# Patient Record
Sex: Female | Born: 1980 | Race: Black or African American | Hispanic: No | Smoking: Current every day smoker
Health system: Southern US, Community
[De-identification: ages and names within clinical notes are randomized; demographics above are authoritative.]

## PROBLEM LIST (undated history)

## (undated) DIAGNOSIS — I10 Essential (primary) hypertension: Secondary | ICD-10-CM

## (undated) DIAGNOSIS — L732 Hidradenitis suppurativa: Secondary | ICD-10-CM

## (undated) HISTORY — DX: Hidradenitis suppurativa: L73.2

## (undated) HISTORY — PX: OTHER SURGICAL HISTORY: SHX169

---

## 2016-06-29 ENCOUNTER — Emergency Department: Payer: Medicaid Other

## 2016-06-29 ENCOUNTER — Emergency Department
Admission: EM | Admit: 2016-06-29 | Discharge: 2016-06-29 | Disposition: A | Discharge status: Home / Self Care | Payer: Medicaid Other | Attending: Emergency Medicine | Admitting: Emergency Medicine

## 2016-06-29 ENCOUNTER — Encounter: Payer: Self-pay | Admitting: Medical Oncology

## 2016-06-29 DIAGNOSIS — J40 Bronchitis, not specified as acute or chronic: Secondary | ICD-10-CM | POA: Insufficient documentation

## 2016-06-29 DIAGNOSIS — I10 Essential (primary) hypertension: Secondary | ICD-10-CM | POA: Insufficient documentation

## 2016-06-29 HISTORY — DX: Essential (primary) hypertension: I10

## 2016-06-29 MED ORDER — PREDNISONE 20 MG PO TABS: 40.0000 mg | ORAL_TABLET | Freq: Every day | ORAL | 0 refills | Status: DC

## 2016-06-29 MED ORDER — ACETAMINOPHEN 325 MG PO TABS
650.0000 mg | ORAL_TABLET | Freq: Once | ORAL | Status: AC
  Administered 2016-06-29: 650 mg via ORAL

## 2016-06-29 MED ORDER — BENZONATATE 100 MG PO CAPS: 100.0000 mg | ORAL_CAPSULE | Freq: Three times a day (TID) | ORAL | 0 refills | Status: AC | PRN

## 2016-06-29 MED ORDER — ACETAMINOPHEN 325 MG PO TABS
ORAL_TABLET | ORAL | Status: AC
  Filled 2016-06-29: qty 2

## 2016-06-29 MED ORDER — ALBUTEROL SULFATE HFA 108 (90 BASE) MCG/ACT IN AERS: 2.0000 | INHALATION_SPRAY | Freq: Four times a day (QID) | RESPIRATORY_TRACT | 2 refills | Status: AC | PRN

## 2016-06-29 NOTE — ED Provider Notes (Signed)
[facility] Provider Note   CSN: [id number] Arrival date & time: 06/29/16  1658     History   Chief Complaint Chief Complaint  Patient presents with  . Cough  . Nasal Congestion    HPI Mary Lawson is a 36 y.o. female presents to the emergency department for evaluation of cough times one month. Patient has had cough and cold symptoms 1 month. Over the last month her cough has become more severe. Cough is slightly productive. No blood. She wakes up in the morning sometimes with chest tightness and wheezing that improves as the day goes on. She denies any fevers. She has a sharp pain along her left thoracic spine that is increased with coughing and taking a deep breath. She denies any chest pain or shortness of breath. No sore throat. Has had some mild nasal congestion. She is not taking any medications for pain. She does not have a PCP. Denies any headache, vision changes chest pain nausea vomiting or weakness. Patient denies any chest pain with exertion.   HPI  Past Medical History:  Diagnosis Date  . Hypertension     There are no active problems to display for this patient.   No past surgical history on file.  OB History    No data available       Home Medications    Prior to Admission medications   Medication Sig Start Date End Date Taking? Authorizing Provider  albuterol (PROVENTIL HFA;VENTOLIN HFA) 108 (90 Base) MCG/ACT inhaler Inhale 2 puffs into the lungs every 6 (six) hours as needed for wheezing or shortness of breath. 06/29/16   Duanne Guess, PA-C  benzonatate (TESSALON PERLES) 100 MG capsule Take 1 capsule (100 mg total) by mouth 3 (three) times daily as needed for cough. 06/29/16 06/29/17  Duanne Guess, PA-C  predniSONE (DELTASONE) 20 MG tablet Take 2 tablets (40 mg total) by mouth daily. 06/29/16   Duanne Guess, PA-C    Family History No family history on file.  Social History Social History  Substance Use Topics  . Smoking status:  Not on file  . Smokeless tobacco: Not on file  . Alcohol use Not on file     Allergies   Codeine; Darvon [propoxyphene]; and Tramadol   Review of Systems Review of Systems  Constitutional: Negative for activity change, chills, fatigue and fever.  HENT: Positive for congestion. Negative for sinus pressure, sore throat and trouble swallowing.   Eyes: Negative for visual disturbance.  Respiratory: Positive for cough, chest tightness and wheezing (in the morning mild). Negative for shortness of breath.   Cardiovascular: Negative for chest pain and leg swelling.  Gastrointestinal: Negative for abdominal pain, diarrhea, nausea and vomiting.  Genitourinary: Negative for dysuria.  Musculoskeletal: Negative for arthralgias and gait problem.  Skin: Negative for rash.  Neurological: Negative for weakness, numbness and headaches.  Hematological: Negative for adenopathy.  Psychiatric/Behavioral: Negative for agitation, behavioral problems and confusion.     Physical Exam Updated Vital Signs BP (!) 178/91 (BP Location: Left Arm)   Pulse 62   Temp 98 F (36.7 C) (Oral)   Resp 18   Ht 5\' 2"  (1.575 m)   Wt 90.7 kg   LMP 06/27/2016   SpO2 100%   BMI 36.58 kg/m   Physical Exam  Constitutional: She is oriented to person, place, and time. She appears well-developed and well-nourished. No distress.  HENT:  Head: Normocephalic and atraumatic.  Right Ear: External ear normal.  Left Ear: External  ear normal.  Mouth/Throat: Oropharynx is clear and moist. No oropharyngeal exudate.  Eyes: EOM are normal. Pupils are equal, round, and reactive to light. Right eye exhibits no discharge. Left eye exhibits no discharge.  Neck: Normal range of motion. Neck supple.  Cardiovascular: Normal rate, regular rhythm and intact distal pulses.   Pulmonary/Chest: Effort normal and breath sounds normal. No respiratory distress. She has no wheezes. She has no rales. She exhibits no tenderness.  Abdominal: Soft.  She exhibits no distension. There is no tenderness. There is no rebound and no guarding. No hernia.  Musculoskeletal: Normal range of motion. She exhibits no edema.  Mild tenderness along the left thoracic paravertebral muscles of the ribs with no spinous process tenderness. Slight increase in pain with taking a deep breath along the thoracic muscles of the left side. No point tenderness along the ribs.  Lymphadenopathy:    She has no cervical adenopathy.  Neurological: She is alert and oriented to person, place, and time. She has normal reflexes.  Skin: Skin is warm and dry.  Psychiatric: She has a normal mood and affect. Her behavior is normal. Thought content normal.     ED Treatments / Results  Labs (all labs ordered are listed, but only abnormal results are displayed) Labs Reviewed - No data to display  EKG  EKG Interpretation None       Radiology Dg Chest 2 View  Result Date: 06/29/2016 CLINICAL DATA:  Cough and shortness of breath for 1 month EXAM: CHEST  2 VIEW COMPARISON:  None. FINDINGS: Normal heart size and mediastinal contours. No acute infiltrate or edema. No effusion or pneumothorax. No osseous findings. IMPRESSION: Negative chest. Electronically Signed   By: Monte Fantasia M.D.   On: 06/29/2016 18:03    Procedures Procedures (including critical care time)  Medications Ordered in ED Medications - No data to display   Initial Impression / Assessment and Plan / ED Course  I have reviewed the triage vital signs and the nursing notes.  Pertinent labs & imaging results that were available during my care of the patient were reviewed by me and considered in my medical decision making (see chart for details).     36 year old female with bronchitis. She is given a prescription for cough medication, prednisone, albuterol inhaler. She is educated on signs and symptoms return to the clinic for. She will establish care with a new PCP.  Final Clinical Impressions(s) / ED  Diagnoses   Final diagnoses:  Bronchitis    New Prescriptions New Prescriptions   ALBUTEROL (PROVENTIL HFA;VENTOLIN HFA) 108 (90 BASE) MCG/ACT INHALER    Inhale 2 puffs into the lungs every 6 (six) hours as needed for wheezing or shortness of breath.   BENZONATATE (TESSALON PERLES) 100 MG CAPSULE    Take 1 capsule (100 mg total) by mouth 3 (three) times daily as needed for cough.   PREDNISONE (DELTASONE) 20 MG TABLET    Take 2 tablets (40 mg total) by mouth daily.     Duanne Guess, PA-C 06/29/16 1824    Nance Pear, MD 06/29/16 2104

## 2016-06-29 NOTE — Discharge Instructions (Signed)
Please take medications as prescribed. Return to the ER for any shortness of breath, difficulty breathing, worsening symptoms such as chest pain shortness of breath or any urgent changes in your health. Please establish care with a PCP tomorrow. You may call [facility] to establish care with a PCP.

## 2016-06-29 NOTE — ED Triage Notes (Signed)
Pt reports she had a cold about a month ago and since then she has had a lingering productive cough with congestion. Pt denies fever, in NAD.

## 2016-06-29 NOTE — ED Notes (Signed)
Pt discharged home after verbalizing understanding of discharge instructions; nad noted. 

## 2016-06-29 NOTE — ED Notes (Signed)
Pt presents with cough x 1 month; becoming productive; denies fevers. States she has some sob sometimes and tightness in her chest. Pt alert & oriented with NAD noted.

## 2017-09-12 ENCOUNTER — Encounter: Payer: Self-pay | Admitting: Emergency Medicine

## 2017-09-12 ENCOUNTER — Emergency Department
Admission: EM | Admit: 2017-09-12 | Discharge: 2017-09-12 | Disposition: A | Discharge status: Home / Self Care | Payer: Self-pay | Attending: Emergency Medicine | Admitting: Emergency Medicine

## 2017-09-12 DIAGNOSIS — Y9389 Activity, other specified: Secondary | ICD-10-CM | POA: Insufficient documentation

## 2017-09-12 DIAGNOSIS — S39011A Strain of muscle, fascia and tendon of abdomen, initial encounter: Secondary | ICD-10-CM

## 2017-09-12 DIAGNOSIS — Y998 Other external cause status: Secondary | ICD-10-CM | POA: Insufficient documentation

## 2017-09-12 DIAGNOSIS — Z79899 Other long term (current) drug therapy: Secondary | ICD-10-CM | POA: Insufficient documentation

## 2017-09-12 DIAGNOSIS — X58XXXA Exposure to other specified factors, initial encounter: Secondary | ICD-10-CM | POA: Insufficient documentation

## 2017-09-12 DIAGNOSIS — Y929 Unspecified place or not applicable: Secondary | ICD-10-CM | POA: Insufficient documentation

## 2017-09-12 DIAGNOSIS — I10 Essential (primary) hypertension: Secondary | ICD-10-CM | POA: Insufficient documentation

## 2017-09-12 LAB — URINALYSIS, COMPLETE (UACMP) WITH MICROSCOPIC
Bilirubin Urine: NEGATIVE
Glucose, UA: NEGATIVE mg/dL
Hgb urine dipstick: NEGATIVE
Ketones, ur: NEGATIVE mg/dL
Nitrite: NEGATIVE
PROTEIN: NEGATIVE mg/dL
Specific Gravity, Urine: 1.021 (ref 1.005–1.030)
pH: 6 (ref 5.0–8.0)

## 2017-09-12 MED ORDER — METHOCARBAMOL 500 MG PO TABS: 500.0000 mg | ORAL_TABLET | Freq: Three times a day (TID) | ORAL | 0 refills | Status: AC | PRN

## 2017-09-12 MED ORDER — MELOXICAM 15 MG PO TABS: 15.0000 mg | ORAL_TABLET | Freq: Every day | ORAL | 1 refills | Status: AC

## 2017-09-12 NOTE — ED Notes (Signed)
See triage note  Presents with left flank pain which started about 3 days ago  Denies any injury ,n/v  Or urinary sxs'  Ambulates well to treatment room

## 2017-09-12 NOTE — ED Provider Notes (Signed)
[facility] Emergency Department Provider Note  ____________________________________________  Time seen: Approximately 4:02 PM  I have reviewed the triage vital signs and the nursing notes.   HISTORY  Chief Complaint Flank Pain    HPI Mary Lawson is a 37 y.o. female presents to the emergency department with left side pain for the past 2 days.  Patient reports that her physical demands have increased at work and she is now been working 12-hour days.  Patient denies falls or mechanisms of trauma.  She denies dysuria, hematuria or low back pain.  Patient reports that she has had a urinary tract infection in the past and her symptoms feel completely different.  She denies possibility of pregnancy as she is not sexually active.  Patient reports that she had some mild nausea today but typically gets some nausea before she has a meal.  She has not experienced vomiting or emesis.  She denies abdominal pain, pelvic pain and vaginal bleeding.  Patient reports that muscles around her abdomen feel tighter and pain is reproducible with palpation. No fever or chills. Patient has taken one Ibuprofen this morning    Past Medical History:  Diagnosis Date  . Hypertension     There are no active problems to display for this patient.   Prior to Admission medications   Medication Sig Start Date End Date Taking? Authorizing Provider  albuterol (PROVENTIL HFA;VENTOLIN HFA) 108 (90 Base) MCG/ACT inhaler Inhale 2 puffs into the lungs every 6 (six) hours as needed for wheezing or shortness of breath. 06/29/16   Duanne Guess, PA-C  meloxicam (MOBIC) 15 MG tablet Take 1 tablet (15 mg total) by mouth daily for 7 days. 09/12/17 09/19/17  Lannie Fields, PA-C  methocarbamol (ROBAXIN) 500 MG tablet Take 1 tablet (500 mg total) by mouth 3 (three) times daily as needed for up to 5 days. 09/12/17 09/17/17  Lannie Fields, PA-C  predniSONE (DELTASONE) 20 MG tablet Take 2 tablets (40 mg total)  by mouth daily. 06/29/16   Duanne Guess, PA-C    Allergies Codeine; Darvon [propoxyphene]; and Tramadol  No family history on file.  Social History Social History   Tobacco Use  . Smoking status: Not on file  Substance Use Topics  . Alcohol use: Not on file  . Drug use: Not on file     Review of Systems  Constitutional: No fever/chills Eyes: No visual changes. No discharge ENT: No upper respiratory complaints. Cardiovascular: no chest pain. Respiratory: no cough. No SOB. Gastrointestinal: No abdominal pain.  No nausea, no vomiting.  No diarrhea.  No constipation. Genitourinary: Negative for dysuria. No hematuria Musculoskeletal: Patient has left side pain. Skin: Negative for rash, abrasions, lacerations, ecchymosis. Neurological: Negative for headaches, focal weakness or numbness.   ____________________________________________   PHYSICAL EXAM:  VITAL SIGNS: ED Triage Vitals  Enc Vitals Group     BP --      Pulse Rate 09/12/17 1415 71     Resp 09/12/17 1415 15     Temp 09/12/17 1415 98.6 F (37 C)     Temp Source 09/12/17 1415 Oral     SpO2 09/12/17 1415 100 %     Weight 09/12/17 1416 215 lb (97.5 kg)     Height 09/12/17 1416 5\' 2"  (1.575 m)     Head Circumference --      Peak Flow --      Pain Score 09/12/17 1416 8     Pain Loc --  Pain Edu? --      Excl. in [place]? --      Constitutional: Alert and oriented. Well appearing and in no acute distress. Eyes: Conjunctivae are normal. PERRL. EOMI. Head: Atraumatic. Cardiovascular: Normal rate, regular rhythm. Normal S1 and S2.  Good peripheral circulation. Respiratory: Normal respiratory effort without tachypnea or retractions. Lungs CTAB. Good air entry to the bases with no decreased or absent breath sounds. Gastrointestinal: Bowel sounds 4 quadrants. Soft and nontender to palpation. No guarding or rigidity. No palpable masses. No distention. No CVA tenderness. Musculoskeletal: Full range of motion to  all extremities. No gross deformities appreciated.  Patient has reproducible tenderness to palpation along the left external oblique muscle. Neurologic:  Normal speech and language. No gross focal neurologic deficits are appreciated.  Skin:  Skin is warm, dry and intact. No rash noted. Psychiatric: Mood and affect are normal. Speech and behavior are normal. Patient exhibits appropriate insight and judgement.   ____________________________________________   LABS (all labs ordered are listed, but only abnormal results are displayed)  Labs Reviewed  URINALYSIS, COMPLETE (UACMP) WITH MICROSCOPIC - Abnormal; Notable for the following components:      Result Value   Color, Urine YELLOW (*)    APPearance CLOUDY (*)    Leukocytes, UA SMALL (*)    Bacteria, UA FEW (*)    Squamous Epithelial / LPF >50 (*)    All other components within normal limits  PREGNANCY, URINE   ____________________________________________  EKG   ____________________________________________  RADIOLOGY   No results found.  ____________________________________________    PROCEDURES  Procedure(s) performed:    Procedures    Medications - No data to display   ____________________________________________   INITIAL IMPRESSION / ASSESSMENT AND PLAN / ED COURSE  Pertinent labs & imaging results that were available during my care of the patient were reviewed by me and considered in my medical decision making (see chart for details).  Review of the [place] CSRS was performed in accordance of the [place] prior to dispensing any controlled drugs.      Assessment and plan Abdominal wall strain Patient presents to the emergency department with left side pain after increased physical activity at work.  Patient reports that she presents to the emergency department for a work note as she had to leave early due to pain.  Original differential diagnosis included cystitis, pyelonephritis and abdominal wall strain.   Urinalysis is reassuring in the emergency department and patient is asymptomatic for cystitis or pyelonephritis.  On physical exam, patient's symptoms were reproduced with palpation and overall abdominal exam was reassuring.  Patient was discharged with meloxicam and Robaxin and desired work note was provided.  All patient questions were answered.    ____________________________________________  FINAL CLINICAL IMPRESSION(S) / ED DIAGNOSES  Final diagnoses:  Strain of abdominal wall, initial encounter      NEW MEDICATIONS STARTED DURING THIS VISIT:  ED Discharge Orders        Ordered    meloxicam (MOBIC) 15 MG tablet  Daily     09/12/17 1600    methocarbamol (ROBAXIN) 500 MG tablet  3 times daily PRN     09/12/17 1600          This chart was dictated using voice recognition software/Dragon. Despite best efforts to proofread, errors can occur which can change the meaning. Any change was purely unintentional.    Lannie Fields, PA-C 09/12/17 1610    Nance Pear, MD 09/12/17 [phone]

## 2017-09-12 NOTE — ED Triage Notes (Signed)
Pt reports left flank pain x2 days, reports pain has been getting worse. Denies V/D, reports nausea. Denies any urinary symptoms.

## 2018-09-20 ENCOUNTER — Other Ambulatory Visit: Payer: Self-pay

## 2018-09-20 ENCOUNTER — Emergency Department: Payer: Self-pay

## 2018-09-20 ENCOUNTER — Emergency Department
Admission: EM | Admit: 2018-09-20 | Discharge: 2018-09-20 | Disposition: A | Discharge status: Home / Self Care | Payer: Self-pay | Attending: Emergency Medicine | Admitting: Emergency Medicine

## 2018-09-20 ENCOUNTER — Encounter: Payer: Self-pay | Admitting: Emergency Medicine

## 2018-09-20 DIAGNOSIS — I10 Essential (primary) hypertension: Secondary | ICD-10-CM | POA: Insufficient documentation

## 2018-09-20 DIAGNOSIS — N83202 Unspecified ovarian cyst, left side: Secondary | ICD-10-CM | POA: Insufficient documentation

## 2018-09-20 DIAGNOSIS — R102 Pelvic and perineal pain: Secondary | ICD-10-CM | POA: Insufficient documentation

## 2018-09-20 DIAGNOSIS — F17200 Nicotine dependence, unspecified, uncomplicated: Secondary | ICD-10-CM | POA: Insufficient documentation

## 2018-09-20 LAB — URINALYSIS, COMPLETE (UACMP) WITH MICROSCOPIC
Bacteria, UA: NONE SEEN
Bilirubin Urine: NEGATIVE
Glucose, UA: NEGATIVE mg/dL
Hgb urine dipstick: NEGATIVE
Ketones, ur: NEGATIVE mg/dL
Nitrite: NEGATIVE
Protein, ur: NEGATIVE mg/dL
Specific Gravity, Urine: 1.016 (ref 1.005–1.030)
pH: 7 (ref 5.0–8.0)

## 2018-09-20 LAB — COMPREHENSIVE METABOLIC PANEL
ALT: 20 U/L (ref 0–44)
AST: 24 U/L (ref 15–41)
Albumin: 4.4 g/dL (ref 3.5–5.0)
Alkaline Phosphatase: 45 U/L (ref 38–126)
Anion gap: 12 (ref 5–15)
BUN: 11 mg/dL (ref 6–20)
CO2: 21 mmol/L — ABNORMAL LOW (ref 22–32)
Calcium: 9.6 mg/dL (ref 8.9–10.3)
Chloride: 103 mmol/L (ref 98–111)
Creatinine, Ser: 0.8 mg/dL (ref 0.44–1.00)
GFR calc Af Amer: >60 mL/min (ref >60)
GFR calc non Af Amer: >60 mL/min (ref >60)
Glucose, Bld: 119 mg/dL — ABNORMAL HIGH (ref 70–99)
Potassium: 4 mmol/L (ref 3.5–5.1)
Sodium: 136 mmol/L (ref 135–145)
Total Bilirubin: 0.8 mg/dL (ref 0.3–1.2)
Total Protein: 8.5 g/dL — ABNORMAL HIGH (ref 6.5–8.1)

## 2018-09-20 LAB — CBC
HCT: 44.4 % (ref 36.0–46.0)
Hemoglobin: 14.4 g/dL (ref 12.0–15.0)
MCH: 28.3 pg (ref 26.0–34.0)
MCHC: 32.4 g/dL (ref 30.0–36.0)
MCV: 87.2 fL (ref 80.0–100.0)
Platelets: 369 10*3/uL (ref 150–400)
RBC: 5.09 MIL/uL (ref 3.87–5.11)
RDW: 15 % (ref 11.5–15.5)
WBC: 14 10*3/uL — ABNORMAL HIGH (ref 4.0–10.5)
nRBC: 0 % (ref 0.0–0.2)

## 2018-09-20 LAB — POCT PREGNANCY, URINE: Preg Test, Ur: NEGATIVE

## 2018-09-20 LAB — LIPASE, BLOOD: Lipase: 26 U/L (ref 11–51)

## 2018-09-20 MED ORDER — FENTANYL CITRATE (PF) 100 MCG/2ML IJ SOLN
50.0000 ug | Freq: Once | INTRAMUSCULAR | Status: AC
Start: 2018-09-20 — End: 2018-09-20
  Administered 2018-09-20: 20:00:00 50 ug via INTRAVENOUS
  Filled 2018-09-20: qty 2

## 2018-09-20 MED ORDER — KETOROLAC TROMETHAMINE 10 MG PO TABS: 10.0000 mg | ORAL_TABLET | Freq: Three times a day (TID) | ORAL | 0 refills | Status: AC | PRN

## 2018-09-20 MED ORDER — KETOROLAC TROMETHAMINE 30 MG/ML IJ SOLN
30.0000 mg | Freq: Once | INTRAMUSCULAR | Status: AC
  Administered 2018-09-20: 22:00:00 30 mg via INTRAVENOUS
  Filled 2018-09-20: qty 1

## 2018-09-20 NOTE — Discharge Instructions (Addendum)
Please seek medical attention for any high fevers, chest pain, shortness of breath, change in behavior, persistent vomiting, bloody stool or any other new or concerning symptoms.  

## 2018-09-20 NOTE — ED Triage Notes (Signed)
Pt here for LLQ pain/left pelvic pain.  Started yesterday.  No vaginal bleeding. No fevers or NVD.  Pt has hx of ectopic pregnancy and kidney stones.  UPT in triage negative.  No hematuria per pt. VSS.  LMP earlier this month.

## 2018-09-20 NOTE — ED Provider Notes (Signed)
[facility] Emergency Department Provider Note   ____________________________________________   I have reviewed the triage vital signs and the nursing notes.   HISTORY  Chief Complaint Abdominal Pain   History limited by: Not Limited   HPI Mary Lawson is a 38 y.o. female who presents to the emergency department today because of concerns for left lower abdominal pain.  Patient states she started having some cramping at last night.  Today however the pain became more severe.  She denies any radiation to the pain.  She states it does remind her of when she has had ectopic pregnancies in the past.  She also has history of kidney stones but denies any change in her urine.  She denies any change in defecation.  No abnormal vaginal bleeding or discharge.  Patient denies any nausea vomiting or fevers.  She denies any trauma to her abdomen.   Records reviewed. Per medical record review patient has a history of HTN  Past Medical History:  Diagnosis Date  . Hypertension     There are no active problems to display for this patient.   History reviewed. No pertinent surgical history.  Prior to Admission medications   Medication Sig Start Date End Date Taking? Authorizing Provider  albuterol (PROVENTIL HFA;VENTOLIN HFA) 108 (90 Base) MCG/ACT inhaler Inhale 2 puffs into the lungs every 6 (six) hours as needed for wheezing or shortness of breath. 06/29/16   Duanne Guess, PA-C  predniSONE (DELTASONE) 20 MG tablet Take 2 tablets (40 mg total) by mouth daily. 06/29/16   Duanne Guess, PA-C    Allergies Codeine, Darvon [propoxyphene], and Tramadol  History reviewed. No pertinent family history.  Social History Social History   Tobacco Use  . Smoking status: Current Every Day Smoker  . Smokeless tobacco: Never Used  Substance Use Topics  . Alcohol use: Never    Frequency: Never  . Drug use: Never    Review of Systems Constitutional: No  fever/chills Eyes: No visual changes. ENT: No sore throat. Cardiovascular: Denies chest pain. Respiratory: Denies shortness of breath. Gastrointestinal: Positive for left lower quadrant pain.    Genitourinary: Negative for dysuria. Musculoskeletal: Negative for back pain. Skin: Negative for rash. Neurological: Negative for headaches, focal weakness or numbness.  ____________________________________________   PHYSICAL EXAM:  VITAL SIGNS: ED Triage Vitals  Enc Vitals Group     BP 09/20/18 1535 (!) 165/90     Pulse Rate 09/20/18 1535 84     Resp 09/20/18 1535 18     Temp 09/20/18 1535 98.6 F (37 C)     Temp Source 09/20/18 1535 Oral     SpO2 09/20/18 1535 100 %     Weight 09/20/18 1532 205 lb (93 kg)     Height 09/20/18 1532 5\' 2"  (1.575 m)     Head Circumference --      Peak Flow --      Pain Score 09/20/18 1531 10   Constitutional: Alert and oriented.  Eyes: Conjunctivae are normal.  ENT      Head: Normocephalic and atraumatic.      Nose: No congestion/rhinnorhea.      Mouth/Throat: Mucous membranes are moist.      Neck: No stridor. Hematological/Lymphatic/Immunilogical: No cervical lymphadenopathy. Cardiovascular: Normal rate, regular rhythm.  No murmurs, rubs, or gallops.  Respiratory: Normal respiratory effort without tachypnea nor retractions. Breath sounds are clear and equal bilaterally. No wheezes/rales/rhonchi. Gastrointestinal: Soft tender to palpation over the left abdomen, worse in the left  lower quadrant.  Genitourinary: Deferred Musculoskeletal: Normal range of motion in all extremities. No lower extremity edema. Neurologic:  Normal speech and language. No gross focal neurologic deficits are appreciated.  Skin:  Skin is warm, dry and intact. No rash noted. Psychiatric: Mood and affect are normal. Speech and behavior are normal. Patient exhibits appropriate insight and judgment.  ____________________________________________    LABS (pertinent  positives/negatives)  Lipase 26 CMP wnl except co2 21, glu 119, t pro 8.5 CBC wbc 14.0, hgb 14.4, plt 369 Upreg neg UA hazy, wbc 6-10 squamous 6-10 ____________________________________________   EKG  None  ____________________________________________    RADIOLOGY  US pelvis Left hemorrhagic cyst  ____________________________________________   PROCEDURES  Procedures  ____________________________________________   INITIAL IMPRESSION / ASSESSMENT AND PLAN / ED COURSE  Pertinent labs & imaging results that were available during my care of the patient were reviewed by me and considered in my medical decision making (see chart for details).   Patient presented to the emergency department today with concerns for left lower quadrant abdominal pain.  Differential would be broad player work-up did show a slight leukocytosis in the blood.  Ultrasound was performed which showed hemorrhagic cyst.  At this point I do think the hemorrhagic cyst is likely the cause of the patient's pain. She did feel improvement after medication here in the emergency department.  Patient will be given OB/GYN follow-up information.  Discussed return precautions.  ____________________________________________   FINAL CLINICAL IMPRESSION(S) / ED DIAGNOSES  Final diagnoses:  Left adnexal tenderness  Cyst of left ovary     Note: This dictation was prepared with Dragon dictation. Any transcriptional errors that result from this process are unintentional     Nance Pear, MD 09/20/18 2246

## 2018-09-22 ENCOUNTER — Encounter: Payer: Self-pay | Admitting: Obstetrics & Gynecology

## 2018-09-22 ENCOUNTER — Other Ambulatory Visit: Payer: Self-pay

## 2018-09-22 ENCOUNTER — Ambulatory Visit (INDEPENDENT_AMBULATORY_CARE_PROVIDER_SITE_OTHER): Payer: Self-pay | Admitting: Obstetrics & Gynecology

## 2018-09-22 VITALS — BP 130/80 | Ht 62.0 in | Wt 216.0 lb

## 2018-09-22 DIAGNOSIS — N83202 Unspecified ovarian cyst, left side: Secondary | ICD-10-CM

## 2018-09-22 DIAGNOSIS — Z3042 Encounter for surveillance of injectable contraceptive: Secondary | ICD-10-CM | POA: Insufficient documentation

## 2018-09-22 MED ORDER — OXYCODONE-ACETAMINOPHEN 5-325 MG PO TABS: 1.0000 | ORAL_TABLET | ORAL | 0 refills | Status: DC | PRN

## 2018-09-22 MED ORDER — MEDROXYPROGESTERONE ACETATE 150 MG/ML IM SUSP: 150.0000 mg | INTRAMUSCULAR | 0 refills | Status: DC

## 2018-09-22 NOTE — Progress Notes (Signed)
  HPI: Patient is a 38 y.o. [id number] who LMP was Patient's last menstrual period was 08/29/2018., presents today for a problem visit.  She complains of recent findings of Left ovarion cyst by Ultrasound - Pelvic Vaginal.  Pt has had symptoms of pain, this began this week and was seen in ER 2 days ago.  Pain is LLQ, rad to back, assoc w mild nausea, severe at times, Toradol and Meloxicam not helping, nothing makes it worse, no other context and has not had h/o ovarian cysts.  Was on Depo in past for period control, on no contraception or blood thinners now.  Desires Depo again.  Periods are heavy..  Previous evaluation: emergency room visit on 7/22. Prior Diagnosis: prior ovarian cyst rupture. Previous Treatment: NSAID Mobic, Toradol.  PMHx: She  has a past medical history of Hypertension. Also,  has no past surgical history on file., family history includes Brain cancer in her maternal grandmother.,  reports that she has been smoking. She has never used smokeless tobacco. She reports that she does not drink alcohol or use drugs.  She has a current medication list which includes the following prescription(s): ketorolac, albuterol, medroxyprogesterone, oxycodone-acetaminophen, and prednisone. Also, is allergic to codeine; darvon [propoxyphene]; and tramadol.  Review of Systems  Constitutional: Negative for chills, fever and malaise/fatigue.  HENT: Negative for congestion, sinus pain and sore throat.   Eyes: Negative for blurred vision and pain.  Respiratory: Negative for cough and wheezing.   Cardiovascular: Negative for chest pain and leg swelling.  Gastrointestinal: Negative for abdominal pain, constipation, diarrhea, heartburn, nausea and vomiting.  Genitourinary: Negative for dysuria, frequency, hematuria and urgency.  Musculoskeletal: Negative for back pain, joint pain, myalgias and neck pain.  Skin: Negative for itching and rash.  Neurological: Negative for dizziness, tremors and weakness.   Endo/Heme/Allergies: Does not bruise/bleed easily.  Psychiatric/Behavioral: Negative for depression. The patient is not nervous/anxious and does not have insomnia.   All other systems reviewed and are negative.   Objective: BP 130/80   Ht 5\' 2"  (1.575 m)   Wt 216 lb (98 kg)   LMP 08/29/2018   BMI 39.51 kg/m  Physical Exam Constitutional:      General: She is not in acute distress.    Appearance: She is well-developed.  Musculoskeletal: Normal range of motion.  Neurological:     Mental Status: She is alert and oriented to person, place, and time.  Skin:    General: Skin is warm and dry.  Vitals signs reviewed.   Pelvic deferred  ASSESSMENT/PLAN:  New onset Ovarian Cyst w Pain  Problem List Items Addressed This Visit      Endocrine   Left ovarian cyst - Primary   Relevant Medications   oxyCODONE-acetaminophen (PERCOCET) 5-325 MG tablet, #20, no RF   Other Relevant Orders   US PELVIC COMPLETE WITH TRANSVAGINAL for follow up in 6 weeks Info provided on cyst care and expectations     Other   Encounter for surveillance of injectable contraceptive   Relevant Medications   medroxyPROGESTERone (DEPO-PROVERA) 150 MG/ML injection    A total of 45 minutes were spent face-to-face with the patient during this encounter and over half of that time dealt with counseling and coordination of care.   Barnett Applebaum, MD, [facility], [facility] 09/22/2018  3:41 PM

## 2018-09-22 NOTE — Patient Instructions (Signed)
Ovarian Cyst     An ovarian cyst is a fluid-filled sac that forms on an ovary. The ovaries are small organs that produce eggs in women. Various types of cysts can form on the ovaries. Some may cause symptoms and require treatment. Most ovarian cysts go away on their own, are not cancerous (are benign), and do not cause problems. Common types of ovarian cysts include:  Functional (follicle) cysts. ? Occur during the menstrual cycle, and usually go away with the next menstrual cycle if you do not get pregnant. ? Usually cause no symptoms.  Endometriomas. ? Are cysts that form from the tissue that lines the uterus (endometrium). ? Are sometimes called "chocolate cysts" because they become filled with blood that turns brown. ? Can cause pain in the lower abdomen during intercourse and during your period.  Cystadenoma cysts. ? Develop from cells on the outside surface of the ovary. ? Can get very large and cause lower abdomen pain and pain with intercourse. ? Can cause severe pain if they twist or break open (rupture).  Dermoid cysts. ? Are sometimes found in both ovaries. ? May contain different kinds of body tissue, such as skin, teeth, hair, or cartilage. ? Usually do not cause symptoms unless they get very big.  Theca lutein cysts. ? Occur when too much of a certain hormone (human chorionic gonadotropin) is produced and overstimulates the ovaries to produce an egg. ? Are most common after having procedures used to assist with the conception of a baby (in vitro fertilization). What are the causes? Ovarian cysts may be caused by:  Ovarian hyperstimulation syndrome. This is a condition that can develop from taking fertility medicines. It causes multiple large ovarian cysts to form.  Polycystic ovarian syndrome (PCOS). This is a common hormonal disorder that can cause ovarian cysts, as well as problems with your period or fertility. What increases the risk? The following factors may  make you more likely to develop ovarian cysts:  Being overweight or obese.  Taking fertility medicines.  Taking certain forms of hormonal birth control.  Smoking. What are the signs or symptoms? Many ovarian cysts do not cause symptoms. If symptoms are present, they may include:  Pelvic pain or pressure.  Pain in the lower abdomen.  Pain during sex.  Abdominal swelling.  Abnormal menstrual periods.  Increasing pain with menstrual periods. How is this diagnosed? These cysts are commonly found during a routine pelvic exam. You may have tests to find out more about the cyst, such as:  Ultrasound.  X-ray of the pelvis.  CT scan.  MRI.  Blood tests. How is this treated? Many ovarian cysts go away on their own without treatment. Your health care provider may want to check your cyst regularly for 2-3 months to see if it changes. If you are in menopause, it is especially important to have your cyst monitored closely because menopausal women have a higher rate of ovarian cancer. When treatment is needed, it may include:  Medicines to help relieve pain.  A procedure to drain the cyst (aspiration).  Surgery to remove the whole cyst.  Hormone treatment or birth control pills. These methods are sometimes used to help dissolve a cyst. Follow these instructions at home:  Take over-the-counter and prescription medicines only as told by your health care provider.  Do not drive or use heavy machinery while taking prescription pain medicine.  Get regular pelvic exams and Pap tests as often as told by your health care provider.    Return to your normal activities as told by your health care provider. Ask your health care provider what activities are safe for you.  Do not use any products that contain nicotine or tobacco, such as cigarettes and e-cigarettes. If you need help quitting, ask your health care provider.  Keep all follow-up visits as told by your health care provider.  This is important. Contact a health care provider if:  Your periods are late, irregular, or painful, or they stop.  You have pelvic pain that does not go away.  You have pressure on your bladder or trouble emptying your bladder completely.  You have pain during sex.  You have any of the following in your abdomen: ? A feeling of fullness. ? Pressure. ? Discomfort. ? Pain that does not go away. ? Swelling.  You feel generally ill.  You become constipated.  You lose your appetite.  You develop severe acne.  You start to have more body hair and facial hair.  You are gaining weight or losing weight without changing your exercise and eating habits.  You think you may be pregnant. Get help right away if:  You have abdominal pain that is severe or gets worse.  You cannot eat or drink without vomiting.  You suddenly develop a fever.  Your menstrual period is much heavier than usual. This information is not intended to replace advice given to you by your health care provider. Make sure you discuss any questions you have with your health care provider. Document Released: 02/15/2005 Document Revised: 05/16/2017 Document Reviewed: 07/20/2015 Elsevier Patient Education  2020 Elsevier Inc.  

## 2018-09-25 ENCOUNTER — Telehealth: Payer: Self-pay | Admitting: Obstetrics and Gynecology

## 2018-09-25 NOTE — Telephone Encounter (Signed)
[facility] ED faxed ED referral form to our office, Pulled pt to confirm office pt has apt at [facility]. Thank you.

## 2018-09-29 ENCOUNTER — Ambulatory Visit: Payer: Medicaid Other

## 2018-10-02 ENCOUNTER — Other Ambulatory Visit: Payer: Self-pay

## 2018-10-02 ENCOUNTER — Ambulatory Visit (INDEPENDENT_AMBULATORY_CARE_PROVIDER_SITE_OTHER): Payer: 59

## 2018-10-02 DIAGNOSIS — Z3042 Encounter for surveillance of injectable contraceptive: Secondary | ICD-10-CM

## 2018-10-02 MED ORDER — MEDROXYPROGESTERONE ACETATE 150 MG/ML IM SUSP
150.0000 mg | Freq: Once | INTRAMUSCULAR | Status: AC
Start: 2018-10-02 — End: 2018-10-02
  Administered 2018-10-02: 150 mg via INTRAMUSCULAR

## 2018-10-02 NOTE — Patient Instructions (Signed)
Pt here today for Depo Provera. This is her first dose being given here at [facility]. Pt states her LMP was 1 week ago on 7/26 and ended 2 days ago. Shot was given in [place], she tolerated well. She inquired about how to schedule an annual with [facility]. Aware to schedule with front desk as she leaves. KJ CMA

## 2018-11-01 ENCOUNTER — Other Ambulatory Visit: Payer: Self-pay

## 2018-11-01 ENCOUNTER — Encounter: Payer: Self-pay | Admitting: Obstetrics & Gynecology

## 2018-11-01 ENCOUNTER — Ambulatory Visit (INDEPENDENT_AMBULATORY_CARE_PROVIDER_SITE_OTHER): Payer: 59

## 2018-11-01 ENCOUNTER — Ambulatory Visit (INDEPENDENT_AMBULATORY_CARE_PROVIDER_SITE_OTHER): Payer: 59 | Admitting: Obstetrics & Gynecology

## 2018-11-01 ENCOUNTER — Other Ambulatory Visit (HOSPITAL_COMMUNITY)
Admission: RE | Admit: 2018-11-01 | Discharge: 2018-11-01 | Disposition: A | Discharge status: Home / Self Care | Payer: Medicaid Other | Source: Ambulatory Visit | Attending: Obstetrics & Gynecology | Admitting: Obstetrics & Gynecology

## 2018-11-01 VITALS — BP 120/80 | Ht 62.0 in | Wt 207.0 lb

## 2018-11-01 DIAGNOSIS — N83201 Unspecified ovarian cyst, right side: Secondary | ICD-10-CM

## 2018-11-01 DIAGNOSIS — N83202 Unspecified ovarian cyst, left side: Secondary | ICD-10-CM | POA: Diagnosis not present

## 2018-11-01 DIAGNOSIS — Z124 Encounter for screening for malignant neoplasm of cervix: Secondary | ICD-10-CM | POA: Insufficient documentation

## 2018-11-01 DIAGNOSIS — D219 Benign neoplasm of connective and other soft tissue, unspecified: Secondary | ICD-10-CM | POA: Diagnosis not present

## 2018-11-01 DIAGNOSIS — D251 Intramural leiomyoma of uterus: Secondary | ICD-10-CM | POA: Diagnosis not present

## 2018-11-01 NOTE — Progress Notes (Signed)
  HPI: Pain inpast, resolved.  No periods on Depo.   Prior Cyst by [place] in July  Ultrasound demonstrates no masses seen, no cysts, +Fibroids  PMHx: She  has a past medical history of Hypertension. Also,  has no past surgical history on file., family history includes Brain cancer in her maternal grandmother.,  reports that she has been smoking. She has never used smokeless tobacco. She reports that she does not drink alcohol or use drugs.  She has a current medication list which includes the following prescription(s): medroxyprogesterone, albuterol, and ketorolac. Also, is allergic to codeine; darvon [propoxyphene]; and tramadol.  Review of Systems  All other systems reviewed and are negative.   Objective: BP 120/80   Ht 5\' 2"  (1.575 m)   Wt 207 lb (93.9 kg)   BMI 37.86 kg/m   Physical examination Constitutional NAD, Conversant  Skin No rashes, lesions or ulceration.   Extremities: Moves all appropriately.  Normal ROM for age. No lymphadenopathy.  Neuro: Grossly intact  Psych: Oriented to PPT.  Normal mood. Normal affect.   Assessment:  Prior cyst Screen for cervical cancer    - PAP today Fibroids    - Monitor for bleeding or pressure  A total of 15 minutes were spent face-to-face with the patient during this encounter and over half of that time dealt with counseling and coordination of care.   Barnett Applebaum, MD, [facility], [facility] 11/01/2018  4:36 PM

## 2018-11-07 LAB — CYTOLOGY - PAP
Diagnosis: NEGATIVE
HPV: NOT DETECTED

## 2018-12-10 ENCOUNTER — Other Ambulatory Visit: Payer: Self-pay

## 2018-12-10 ENCOUNTER — Emergency Department
Admission: EM | Admit: 2018-12-10 | Discharge: 2018-12-10 | Disposition: A | Discharge status: Home / Self Care | Payer: Worker's Compensation | Attending: Emergency Medicine | Admitting: Emergency Medicine

## 2018-12-10 ENCOUNTER — Encounter: Payer: Self-pay | Admitting: Emergency Medicine

## 2018-12-10 ENCOUNTER — Emergency Department: Payer: Worker's Compensation

## 2018-12-10 DIAGNOSIS — Y929 Unspecified place or not applicable: Secondary | ICD-10-CM | POA: Diagnosis not present

## 2018-12-10 DIAGNOSIS — I10 Essential (primary) hypertension: Secondary | ICD-10-CM | POA: Insufficient documentation

## 2018-12-10 DIAGNOSIS — Y939 Activity, unspecified: Secondary | ICD-10-CM | POA: Insufficient documentation

## 2018-12-10 DIAGNOSIS — W208XXA Other cause of strike by thrown, projected or falling object, initial encounter: Secondary | ICD-10-CM | POA: Diagnosis not present

## 2018-12-10 DIAGNOSIS — S0990XA Unspecified injury of head, initial encounter: Secondary | ICD-10-CM

## 2018-12-10 DIAGNOSIS — Y990 Civilian activity done for income or pay: Secondary | ICD-10-CM | POA: Insufficient documentation

## 2018-12-10 DIAGNOSIS — Z79899 Other long term (current) drug therapy: Secondary | ICD-10-CM | POA: Diagnosis not present

## 2018-12-10 DIAGNOSIS — F17210 Nicotine dependence, cigarettes, uncomplicated: Secondary | ICD-10-CM | POA: Diagnosis not present

## 2018-12-10 DIAGNOSIS — S161XXA Strain of muscle, fascia and tendon at neck level, initial encounter: Secondary | ICD-10-CM | POA: Insufficient documentation

## 2018-12-10 MED ORDER — HYDROCODONE-ACETAMINOPHEN 5-325 MG PO TABS
1.0000 | ORAL_TABLET | Freq: Once | ORAL | Status: AC
  Administered 2018-12-10: 14:00:00 1 via ORAL
  Filled 2018-12-10: qty 1

## 2018-12-10 MED ORDER — HYDROCODONE-ACETAMINOPHEN 5-325 MG PO TABS: 1.0000 | ORAL_TABLET | Freq: Four times a day (QID) | ORAL | 0 refills | Status: DC | PRN

## 2018-12-10 MED ORDER — METHOCARBAMOL 500 MG PO TABS: 500.0000 mg | ORAL_TABLET | Freq: Four times a day (QID) | ORAL | 0 refills | Status: AC

## 2018-12-10 NOTE — ED Notes (Signed)
Patient works for [employer]

## 2018-12-10 NOTE — Discharge Instructions (Signed)
Follow-up with the acute care or a physician Worker's Comp. choice if not improving in 3 days.  Use medication as prescribed.  Apply ice to the bony areas of your neck and wet heat to the muscles.  Use Tylenol and ibuprofen along with a muscle relaxer.  Pain medication for pain not controlled by these medications.  Return to emergency department if worsening headache.

## 2018-12-10 NOTE — ED Triage Notes (Signed)
C/O headache, nausea, neck pain x 3 days.  States a garage type door hit head while at work on Thursday.  AAOx3.  Skin warm and dry.  NAD

## 2018-12-10 NOTE — ED Provider Notes (Signed)
[facility] Emergency Department Provider Note  ____________________________________________   First MD Initiated Contact with Patient 12/10/18 1113     (approximate)  I have reviewed the triage vital signs and the nursing notes.   HISTORY  Chief Complaint Head Injury    HPI Mary Lawson is a 38 y.o. female presents emergency department complaining of a headache, nausea and neck pain for 3 days.  States a heavy metal garage type door came down on top of her head while at work.  No LOC.  States her headache has been increasing over the past few days.  She has swelling on top of her head.  No vomiting but does have nausea.  No change in vision    Past Medical History:  Diagnosis Date  . Hypertension     Patient Active Problem List   Diagnosis Date Noted  . Encounter for surveillance of injectable contraceptive 09/22/2018  . Left ovarian cyst 09/22/2018    History reviewed. No pertinent surgical history.  Prior to Admission medications   Medication Sig Start Date End Date Taking? Authorizing Provider  albuterol (PROVENTIL HFA;VENTOLIN HFA) 108 (90 Base) MCG/ACT inhaler Inhale 2 puffs into the lungs every 6 (six) hours as needed for wheezing or shortness of breath. Patient not taking: Reported on 11/01/2018 06/29/16   Duanne Guess, PA-C  HYDROcodone-acetaminophen (NORCO/VICODIN) 5-325 MG tablet Take 1 tablet by mouth every 6 (six) hours as needed for moderate pain. 12/10/18   Juna Caban, Linden Dolin, PA-C  ketorolac (TORADOL) 10 MG tablet Take 1 tablet (10 mg total) by mouth every 8 (eight) hours as needed for severe pain. 09/20/18   Nance Pear, MD  medroxyPROGESTERone (DEPO-PROVERA) 150 MG/ML injection Inject 1 mL (150 mg total) into the muscle every 3 (three) months. 09/22/18   Gae Dry, MD  methocarbamol (ROBAXIN) 500 MG tablet Take 1 tablet (500 mg total) by mouth 4 (four) times daily. 12/10/18   Versie Starks, PA-C    Allergies Codeine,  Darvon [propoxyphene], and Tramadol  Family History  Problem Relation Age of Onset  . Brain cancer Maternal Grandmother     Social History Social History   Tobacco Use  . Smoking status: Current Every Day Smoker  . Smokeless tobacco: Never Used  Substance Use Topics  . Alcohol use: Never    Frequency: Never  . Drug use: Never    Review of Systems  Constitutional: No fever/chills, positive headache and head injury Eyes: No visual changes. ENT: No sore throat. Respiratory: Denies cough Genitourinary: Negative for dysuria. Musculoskeletal: Negative for back pain. Skin: Negative for rash.    ____________________________________________   PHYSICAL EXAM:  VITAL SIGNS: ED Triage Vitals  Enc Vitals Group     BP 12/10/18 1052 (!) 147/93     Pulse Rate 12/10/18 1052 72     Resp 12/10/18 1052 15     Temp 12/10/18 1052 98.9 F (37.2 C)     Temp Source 12/10/18 1052 Oral     SpO2 12/10/18 1052 99 %     Weight 12/10/18 1052 198 lb (89.8 kg)     Height 12/10/18 1052 5\' 2"  (1.575 m)     Head Circumference --      Peak Flow --      Pain Score 12/10/18 1056 8     Pain Loc --      Pain Edu? --      Excl. in [place]? --     Constitutional: Alert and oriented. Well  appearing and in no acute distress. Eyes: Conjunctivae are normal.  Head: Tender at the top of the skull, area is a little fluctuant like a hematoma. Nose: No congestion/rhinnorhea. Mouth/Throat: Mucous membranes are moist.   Neck:  supple no lymphadenopathy noted Cardiovascular: Normal rate, regular rhythm.  Respiratory: Normal respiratory effort.  No retractions GU: deferred Musculoskeletal: FROM all extremities, warm and well perfused, C-spine is tender, grips equal bilaterally Neurologic:  Normal speech and language.  Cranial nerves II through XII grossly intact Skin:  Skin is warm, dry and intact. No rash noted. Psychiatric: Mood and affect are normal. Speech and behavior are normal.   ____________________________________________   LABS (all labs ordered are listed, but only abnormal results are displayed)  Labs Reviewed - No data to display ____________________________________________   ____________________________________________  RADIOLOGY  CT of the head and C-spine are both negative  ____________________________________________   PROCEDURES  Procedure(s) performed: Vicodin 1 PM   Procedures    ____________________________________________   INITIAL IMPRESSION / ASSESSMENT AND PLAN / ED COURSE  Pertinent labs & imaging results that were available during my care of the patient were reviewed by me and considered in my medical decision making (see chart for details).   Patient is a 38 year old female presents emergency department complaint of headache and neck pain after a Worker's Comp. injury.  States a heavy metal door fell on top of her head.  She states even her employer notes that the door is faulty.  Physical exam shows the top of the skull to be very tender and almost fluctuant.  C-spine is tender.  Due to the nature of the headache and injury CT of the head and C-spine are ordered.    CT the head and C-spine read as negative  Vicodin 1 p.o.  Explained all the findings to the patient.  She was given a light duty work note.  She is to follow-up with physician of Worker's Comp. choice if not better in 3 days.  Explained to her that with the impact to the top of the head making her hyperextend her neck that she could have a muscle strain creating more headaches.  She was given a muscle relaxer and pain medication.  She is to follow-up with physician of Worker's Comp. choice.  She states she understands will comply.  She is discharged stable condition.   Mary Lawson was evaluated in Emergency Department on 12/10/2018 for the symptoms described in the history of present illness. She was evaluated in the context of the global COVID-19 pandemic,  which necessitated consideration that the patient might be at risk for infection with the SARS-CoV-2 virus that causes COVID-19. Institutional protocols and algorithms that pertain to the evaluation of patients at risk for COVID-19 are in a state of rapid change based on information released by regulatory bodies including the CDC and federal and state organizations. These policies and algorithms were followed during the patient's care in the ED.   As part of my medical decision making, I reviewed the following data within the [place] notes reviewed and incorporated, Old chart reviewed, Radiograph reviewed CT of the head C-spine is negative, Notes from prior ED visits and [place] Controlled Substance Database  ____________________________________________   FINAL CLINICAL IMPRESSION(S) / ED DIAGNOSES  Final diagnoses:  Minor head injury, initial encounter  Strain of neck muscle, initial encounter      NEW MEDICATIONS STARTED DURING THIS VISIT:  Discharge Medication List as of 12/10/2018  1:21 PM  START taking these medications   Details  HYDROcodone-acetaminophen (NORCO/VICODIN) 5-325 MG tablet Take 1 tablet by mouth every 6 (six) hours as needed for moderate pain., Starting Sun 12/10/2018, Normal    methocarbamol (ROBAXIN) 500 MG tablet Take 1 tablet (500 mg total) by mouth 4 (four) times daily., Starting Sun 12/10/2018, Normal         Note:  This document was prepared using Dragon voice recognition software and may include unintentional dictation errors.    Versie Starks, PA-C 12/10/18 1631    Duffy Bruce, MD 12/12/18 [phone]

## 2018-12-25 ENCOUNTER — Ambulatory Visit: Payer: Medicaid Other

## 2019-02-19 ENCOUNTER — Ambulatory Visit (LOCAL_COMMUNITY_HEALTH_CENTER): Payer: Medicaid Other | Admitting: Physician Assistant

## 2019-02-19 ENCOUNTER — Encounter: Payer: Self-pay | Admitting: Physician Assistant

## 2019-02-19 ENCOUNTER — Other Ambulatory Visit: Payer: Self-pay

## 2019-02-19 VITALS — BP 135/86 | Ht 63.0 in | Wt 209.0 lb

## 2019-02-19 DIAGNOSIS — Z30013 Encounter for initial prescription of injectable contraceptive: Secondary | ICD-10-CM

## 2019-02-19 DIAGNOSIS — Z3042 Encounter for surveillance of injectable contraceptive: Secondary | ICD-10-CM

## 2019-02-19 DIAGNOSIS — Z3009 Encounter for other general counseling and advice on contraception: Secondary | ICD-10-CM

## 2019-02-19 MED ORDER — MEDROXYPROGESTERONE ACETATE 150 MG/ML IM SUSP
150.0000 mg | INTRAMUSCULAR | Status: AC
  Administered 2019-02-19 – 2019-08-10 (×3): 150 mg via INTRAMUSCULAR

## 2019-02-19 NOTE — Progress Notes (Signed)
Pt here for Depo restart. Pt reports last Depo was at [facility] ~09/21/2018 per pt. Pt denies any issues with Depo.Ronny Bacon, RN

## 2019-02-19 NOTE — Progress Notes (Signed)
Family Planning Visit-  Subjective:  Mary Lawson is a 38 y.o. being seen today for an well woman visit and to continue with Depo.    She is currently using Depo-Provera injections for pregnancy prevention. Patient reports she does not want a pregnancy in the next year. Patient  has Encounter for surveillance of injectable contraceptive and Left ovarian cyst on their problem list.  Chief Complaint  Patient presents with  . Contraception    depo    Patient reports that she had a Depo in July or August at [facility] and had her pap there 11/01/18.  Desires to continue with the Depo.  Reports that she has had surgery on both tubes due to ectopic pregnancy and now has 1.5 tubes.  Currently taking a muscle relaxer due to neck injury at work.  Smokes 1 ppd of cigarettes.  Family hx of brain cancer in [facility] and asthma/COPD in mother.  Patient denies any chronic conditions.  Declines pelvic exam today for STD screening.    Does the patient desire a pregnancy in the next year? (OKQ flowsheet)  See flowsheet for other program required questions.   Body mass index is 37.02 kg/m. - Patient is eligible for diabetes screening based on BMI and age [id number]?  not applicable [id number] ordered? not applicable  Patient reports 1 of partners in last year. Desires STI screening?  No - .  Does the patient have a current or past history of drug use? No   No components found for: HCV]   Health Maintenance Due  Topic Date Due  . HIV Screening  11/26/1994  . TETANUS/TDAP  11/26/1998  . INFLUENZA VACCINE  09/30/2018    Review of Systems  All other systems reviewed and are negative.   The following portions of the patient's history were reviewed and updated as appropriate: allergies, current medications, past family history, past medical history, past social history, past surgical history and problem list. Problem list updated.  Objective:   Vitals:   02/19/19 1321  BP: 135/86  Weight: 209 lb (94.8 kg)  Height:  5\' 3"  (1.6 m)    Physical Exam Vitals reviewed.  Constitutional:      General: She is not in acute distress.    Appearance: Normal appearance. She is obese.  HENT:     Head: Normocephalic and atraumatic.  Eyes:     Conjunctiva/sclera: Conjunctivae normal.  Pulmonary:     Effort: Pulmonary effort is normal.  Skin:    General: Skin is warm and dry.  Neurological:     Mental Status: She is alert and oriented to person, place, and time.  Psychiatric:        Mood and Affect: Mood normal.        Thought Content: Thought content normal.        Judgment: Judgment normal.       Assessment and Plan:  Mary Lawson is a 38 y.o. female presenting to the [facility] Department for an initial well woman exam/family planning visit  Contraception counseling: Reviewed all forms of birth control options in the tiered based approach. available including abstinence; over the counter/barrier methods; hormonal contraceptive medication including pill, patch, ring, injection,contraceptive implant; hormonal and nonhormonal IUDs; permanent sterilization options including vasectomy and the various tubal sterilization modalities. Risks, benefits, and typical effectiveness rates were reviewed.  Questions were answered.  Written information was also given to the patient to review.  Patient desires to continue with Depo, this was prescribed for patient. She  will follow up in  3 months and prn for surveillance.  She was told to call with any further questions, or with any concerns about this method of contraception.  Emphasized use of condoms 100% of the time for STI prevention.  Patient was not a candidate for ECP today.   1. Encounter for counseling regarding contraception Reviewed with patient normal SE of Depo and when to call clinic for irregular bleeding. Rec condoms with all sex for STD protection. - medroxyPROGESTERone (DEPO-PROVERA) injection 150 mg  2. Surveillance for Depo-Provera  contraception OK for Depo 150mg  IM q 11-13 weeks until RP due 10/2019. - medroxyPROGESTERone (DEPO-PROVERA) injection 150 mg     No follow-ups on file.  No future appointments.  Jerene Dilling, PA

## 2019-02-19 NOTE — Progress Notes (Signed)
Pt received Depo 150mg  IM per provider order. Pt tolerated well. Provider orders completed.Ronny Bacon, RN

## 2019-05-15 ENCOUNTER — Other Ambulatory Visit: Payer: Self-pay

## 2019-05-15 ENCOUNTER — Ambulatory Visit (LOCAL_COMMUNITY_HEALTH_CENTER): Payer: Medicaid Other

## 2019-05-15 VITALS — BP 131/87 | Ht 63.0 in | Wt 208.0 lb

## 2019-05-15 DIAGNOSIS — Z3042 Encounter for surveillance of injectable contraceptive: Secondary | ICD-10-CM

## 2019-05-15 DIAGNOSIS — Z30013 Encounter for initial prescription of injectable contraceptive: Secondary | ICD-10-CM

## 2019-05-15 DIAGNOSIS — Z3009 Encounter for other general counseling and advice on contraception: Secondary | ICD-10-CM

## 2019-05-15 NOTE — Progress Notes (Signed)
12.1 weeks post depo today. DMPA 150 mg IM administered today per Antoine Primas, PA order dated 02/19/2019 (depo order until 10/2019).

## 2019-06-21 ENCOUNTER — Emergency Department
Admission: EM | Admit: 2019-06-21 | Discharge: 2019-06-21 | Disposition: A | Discharge status: Home / Self Care | Payer: No Typology Code available for payment source | Attending: Emergency Medicine | Admitting: Emergency Medicine

## 2019-06-21 ENCOUNTER — Encounter: Payer: Self-pay | Admitting: Emergency Medicine

## 2019-06-21 ENCOUNTER — Other Ambulatory Visit: Payer: Self-pay

## 2019-06-21 DIAGNOSIS — M545 Low back pain, unspecified: Secondary | ICD-10-CM

## 2019-06-21 DIAGNOSIS — R109 Unspecified abdominal pain: Secondary | ICD-10-CM | POA: Insufficient documentation

## 2019-06-21 DIAGNOSIS — F17210 Nicotine dependence, cigarettes, uncomplicated: Secondary | ICD-10-CM | POA: Insufficient documentation

## 2019-06-21 DIAGNOSIS — N3000 Acute cystitis without hematuria: Secondary | ICD-10-CM

## 2019-06-21 DIAGNOSIS — Z79899 Other long term (current) drug therapy: Secondary | ICD-10-CM | POA: Insufficient documentation

## 2019-06-21 DIAGNOSIS — I10 Essential (primary) hypertension: Secondary | ICD-10-CM | POA: Insufficient documentation

## 2019-06-21 LAB — URINALYSIS, COMPLETE (UACMP) WITH MICROSCOPIC
Bacteria, UA: NONE SEEN
Bilirubin Urine: NEGATIVE
Glucose, UA: NEGATIVE mg/dL
Ketones, ur: NEGATIVE mg/dL
Leukocytes,Ua: NEGATIVE
Nitrite: NEGATIVE
Protein, ur: NEGATIVE mg/dL
Specific Gravity, Urine: 1.017 (ref 1.005–1.030)
pH: 7 (ref 5.0–8.0)

## 2019-06-21 LAB — BASIC METABOLIC PANEL
Anion gap: 8 (ref 5–15)
BUN: 12 mg/dL (ref 6–20)
CO2: 28 mmol/L (ref 22–32)
Calcium: 9.7 mg/dL (ref 8.9–10.3)
Chloride: 103 mmol/L (ref 98–111)
Creatinine, Ser: 0.74 mg/dL (ref 0.44–1.00)
GFR calc Af Amer: >60 mL/min (ref >60)
GFR calc non Af Amer: >60 mL/min (ref >60)
Glucose, Bld: 106 mg/dL — ABNORMAL HIGH (ref 70–99)
Potassium: 4 mmol/L (ref 3.5–5.1)
Sodium: 139 mmol/L (ref 135–145)

## 2019-06-21 LAB — POCT PREGNANCY, URINE: Preg Test, Ur: NEGATIVE

## 2019-06-21 LAB — CBC
HCT: 41.8 % (ref 36.0–46.0)
Hemoglobin: 13.5 g/dL (ref 12.0–15.0)
MCH: 28.2 pg (ref 26.0–34.0)
MCHC: 32.3 g/dL (ref 30.0–36.0)
MCV: 87.4 fL (ref 80.0–100.0)
Platelets: 391 10*3/uL (ref 150–400)
RBC: 4.78 MIL/uL (ref 3.87–5.11)
RDW: 15 % (ref 11.5–15.5)
WBC: 13.4 10*3/uL — ABNORMAL HIGH (ref 4.0–10.5)
nRBC: 0 % (ref 0.0–0.2)

## 2019-06-21 MED ORDER — IBUPROFEN 600 MG PO TABS: 600.0000 mg | ORAL_TABLET | Freq: Three times a day (TID) | ORAL | 0 refills | Status: AC

## 2019-06-21 MED ORDER — CEPHALEXIN 500 MG PO CAPS: 500.0000 mg | ORAL_CAPSULE | Freq: Three times a day (TID) | ORAL | 0 refills | Status: AC

## 2019-06-21 NOTE — ED Triage Notes (Signed)
Patient to ER for c/o bilateral flank pain x1 week. Denies any known fevers. Reports h/o kidney stones. States feels similar to previous UTI and kidney stones.

## 2019-06-21 NOTE — ED Notes (Signed)
Pt verbalized understanding of discharge instructions. NAD at this time. 

## 2019-06-21 NOTE — ED Provider Notes (Signed)
[facility] Emergency Department Provider Note  ____________________________________________   First MD Initiated Contact with Patient 06/21/19 1131     (approximate)  I have reviewed the triage vital signs and the nursing notes.   HISTORY  Chief Complaint Flank Pain    HPI Mary Lawson is a 39 y.o. female  Here with mild lower back pain. Pt reports that for the past 2 days, she has had mild, paraspinal lower back pain which is worse with bending and palpation. She works standing up for long periods of times and believes she may have slept on it wrong, or hurt it at work. No falls, however. No LE weakness, numbness, or radiation down legs. No abdominal pain, dysuria, frequency, nausea, vomiting. Pain is worse w/ movement, palpation. No alleviating factors. No overt hematuria. No vaginal bleeding or discharge.        Past Medical History:  Diagnosis Date  . Hypertension     Patient Active Problem List   Diagnosis Date Noted  . Encounter for surveillance of injectable contraceptive 09/22/2018  . Left ovarian cyst 09/22/2018    History reviewed. No pertinent surgical history.  Prior to Admission medications   Medication Sig Start Date End Date Taking? Authorizing Provider  albuterol (PROVENTIL HFA;VENTOLIN HFA) 108 (90 Base) MCG/ACT inhaler Inhale 2 puffs into the lungs every 6 (six) hours as needed for wheezing or shortness of breath. 06/29/16   Duanne Guess, PA-C  cephALEXin (KEFLEX) 500 MG capsule Take 1 capsule (500 mg total) by mouth 3 (three) times daily for 5 days. 06/21/19 06/26/19  Duffy Bruce, MD  HYDROcodone-acetaminophen (NORCO/VICODIN) 5-325 MG tablet Take 1 tablet by mouth every 6 (six) hours as needed for moderate pain. Patient not taking: Reported on 05/15/2019 12/10/18   Versie Starks, PA-C  ibuprofen (ADVIL) 600 MG tablet Take 1 tablet (600 mg total) by mouth 3 (three) times daily for 7 days. 06/21/19 06/28/19  Duffy Bruce,  MD  ketorolac (TORADOL) 10 MG tablet Take 1 tablet (10 mg total) by mouth every 8 (eight) hours as needed for severe pain. Patient not taking: Reported on 05/15/2019 09/20/18   Nance Pear, MD  medroxyPROGESTERone (DEPO-PROVERA) 150 MG/ML injection Inject 1 mL (150 mg total) into the muscle every 3 (three) months. 09/22/18   Gae Dry, MD  methocarbamol (ROBAXIN) 500 MG tablet Take 1 tablet (500 mg total) by mouth 4 (four) times daily. Patient not taking: Reported on 02/19/2019 12/10/18   Versie Starks, PA-C    Allergies Codeine, Darvon [propoxyphene], and Tramadol  Family History  Problem Relation Age of Onset  . Brain cancer Maternal Grandmother     Social History Social History   Tobacco Use  . Smoking status: Current Every Day Smoker  . Smokeless tobacco: Never Used  Substance Use Topics  . Alcohol use: Never  . Drug use: Never    Review of Systems  Review of Systems  Constitutional: Negative for fatigue and fever.  HENT: Negative for congestion and sore throat.   Eyes: Negative for visual disturbance.  Respiratory: Negative for cough and shortness of breath.   Cardiovascular: Negative for chest pain.  Gastrointestinal: Negative for abdominal pain, diarrhea, nausea and vomiting.  Genitourinary: Positive for flank pain.  Musculoskeletal: Positive for back pain. Negative for neck pain.  Skin: Negative for rash and wound.  Neurological: Negative for weakness.  All other systems reviewed and are negative.    ____________________________________________  PHYSICAL EXAM:      VITAL  SIGNS: ED Triage Vitals  Enc Vitals Group     BP 06/21/19 1010 (!) 150/102     Pulse Rate 06/21/19 1010 77     Resp 06/21/19 1010 18     Temp 06/21/19 1010 98.6 F (37 C)     Temp Source 06/21/19 1010 Oral     SpO2 06/21/19 1010 100 %     Weight 06/21/19 1011 207 lb (93.9 kg)     Height 06/21/19 1011 5\' 2"  (1.575 m)     Head Circumference --      Peak Flow --      Pain  Score 06/21/19 1015 7     Pain Loc --      Pain Edu? --      Excl. in [place]? --      Physical Exam Vitals and nursing note reviewed.  Constitutional:      General: She is not in acute distress.    Appearance: She is well-developed.  HENT:     Head: Normocephalic and atraumatic.  Eyes:     Conjunctiva/sclera: Conjunctivae normal.  Cardiovascular:     Rate and Rhythm: Normal rate and regular rhythm.     Heart sounds: Normal heart sounds. No murmur. No friction rub.  Pulmonary:     Effort: Pulmonary effort is normal. No respiratory distress.     Breath sounds: Normal breath sounds. No wheezing or rales.  Abdominal:     General: There is no distension.     Palpations: Abdomen is soft.     Tenderness: There is no abdominal tenderness.     Comments: No suprapubic or bilateral LQ TTP. No rebound or guarding. No rashes or skin lesions.  Musculoskeletal:     Cervical back: Neck supple.     Comments: Moderate bilateral paraspinal TTP along lumbar spine. No bruising or deformity. No midline TTP  Skin:    General: Skin is warm.     Capillary Refill: Capillary refill takes less than 2 seconds.  Neurological:     Mental Status: She is alert and oriented to person, place, and time.     Motor: No abnormal muscle tone.     Comments: Strength 5/5 bl LE. Normal sensation to light touch.       ____________________________________________   LABS (all labs ordered are listed, but only abnormal results are displayed)  Labs Reviewed  URINALYSIS, COMPLETE (UACMP) WITH MICROSCOPIC - Abnormal; Notable for the following components:      Result Value   Color, Urine YELLOW (*)    APPearance CLEAR (*)    Hgb urine dipstick MODERATE (*)    All other components within normal limits  CBC - Abnormal; Notable for the following components:   WBC 13.4 (*)    All other components within normal limits  BASIC METABOLIC PANEL - Abnormal; Notable for the following components:   Glucose, Bld 106 (*)     All other components within normal limits  POC URINE PREG, ED  POCT PREGNANCY, URINE    ____________________________________________  EKG: None ________________________________________  RADIOLOGY All imaging, including plain films, CT scans, and ultrasounds, independently reviewed by me, and interpretations confirmed via formal radiology reads.  ED MD interpretation:   None  Official radiology report(s): No results found.  ____________________________________________  PROCEDURES   Procedure(s) performed (including Critical Care):  Procedures  ____________________________________________  INITIAL IMPRESSION / MDM / [place] / ED COURSE  As part of my medical decision making, I reviewed the following data within  the electronic MEDICAL RECORD NUMBER Nursing notes reviewed and incorporated, Old chart reviewed, Notes from prior ED visits, and [place] Controlled Substance Database       *Jaimelee Hanover was evaluated in Emergency Department on 06/21/2019 for the symptoms described in the history of present illness. She was evaluated in the context of the global COVID-19 pandemic, which necessitated consideration that the patient might be at risk for infection with the SARS-CoV-2 virus that causes COVID-19. Institutional protocols and algorithms that pertain to the evaluation of patients at risk for COVID-19 are in a state of rapid change based on information released by regulatory bodies including the CDC and federal and state organizations. These policies and algorithms were followed during the patient's care in the ED.  Some ED evaluations and interventions may be delayed as a result of limited staffing during the pandemic.*     Medical Decision Making:  39 yo F here with mild paraspinal lower back and flank pain. Suspect this is musculoskeletal given paraspinal location, reproducibility on exam, and positional nature of it. BMP is unremarkable. She has no focal abd TTP to suggest  appendicitis, cholecystitis, or diverticulitis and she has had no nausea, vomiting. No vaginal discharge or signs of PID. Of note, she does have mild leukocytosis and UA with pyuria, though she has no significant sx of UTI.   Suspect MSK low back pain, less likely bladder spasms. No fever, n/v, CVAT or signs of pyelo. Will treat with short course of ABX, anti-inflammatories. No severe flank pain or sx to suggest nephrolithiasis or other complication.' ____________________________________________  FINAL CLINICAL IMPRESSION(S) / ED DIAGNOSES  Final diagnoses:  Acute bilateral low back pain without sciatica  Acute cystitis without hematuria     MEDICATIONS GIVEN DURING THIS VISIT:  Medications - No data to display   ED Discharge Orders         Ordered    cephALEXin (KEFLEX) 500 MG capsule  3 times daily     06/21/19 1218    ibuprofen (ADVIL) 600 MG tablet  3 times daily     06/21/19 1218           Note:  This document was prepared using Dragon voice recognition software and may include unintentional dictation errors.   Duffy Bruce, MD 06/21/19 2011

## 2019-08-02 ENCOUNTER — Ambulatory Visit: Payer: Medicaid Other

## 2019-08-10 ENCOUNTER — Ambulatory Visit (LOCAL_COMMUNITY_HEALTH_CENTER): Payer: Medicaid Other | Admitting: Advanced Practice Midwife

## 2019-08-10 ENCOUNTER — Encounter: Payer: Self-pay | Admitting: Advanced Practice Midwife

## 2019-08-10 ENCOUNTER — Other Ambulatory Visit: Payer: Self-pay

## 2019-08-10 VITALS — BP 146/102 | Ht 62.0 in | Wt 199.4 lb

## 2019-08-10 DIAGNOSIS — R030 Elevated blood-pressure reading, without diagnosis of hypertension: Secondary | ICD-10-CM | POA: Insufficient documentation

## 2019-08-10 DIAGNOSIS — Z113 Encounter for screening for infections with a predominantly sexual mode of transmission: Secondary | ICD-10-CM

## 2019-08-10 DIAGNOSIS — Z3042 Encounter for surveillance of injectable contraceptive: Secondary | ICD-10-CM

## 2019-08-10 DIAGNOSIS — B9689 Other specified bacterial agents as the cause of diseases classified elsewhere: Secondary | ICD-10-CM

## 2019-08-10 DIAGNOSIS — F1290 Cannabis use, unspecified, uncomplicated: Secondary | ICD-10-CM | POA: Insufficient documentation

## 2019-08-10 DIAGNOSIS — F17200 Nicotine dependence, unspecified, uncomplicated: Secondary | ICD-10-CM | POA: Insufficient documentation

## 2019-08-10 DIAGNOSIS — Z3009 Encounter for other general counseling and advice on contraception: Secondary | ICD-10-CM

## 2019-08-10 DIAGNOSIS — Z30013 Encounter for initial prescription of injectable contraceptive: Secondary | ICD-10-CM

## 2019-08-10 DIAGNOSIS — E669 Obesity, unspecified: Secondary | ICD-10-CM | POA: Insufficient documentation

## 2019-08-10 LAB — WET PREP FOR TRICH, YEAST, CLUE
Trichomonas Exam: NEGATIVE
Yeast Exam: NEGATIVE

## 2019-08-10 MED ORDER — THERA VITAL M PO TABS: 1.0000 | ORAL_TABLET | Freq: Every day | ORAL | 0 refills | Status: AC

## 2019-08-10 MED ORDER — METRONIDAZOLE 500 MG PO TABS: 500.0000 mg | ORAL_TABLET | Freq: Two times a day (BID) | ORAL | 0 refills | Status: AC

## 2019-08-10 NOTE — Progress Notes (Signed)
Patient here today for Depo at 12 3/7 since last Depo. Patient also want std testing today. Patient counseled she will need PE with next Depo.Jenetta Downer, RN

## 2019-08-10 NOTE — Addendum Note (Signed)
Addended by: Jenetta Downer on: 08/10/2019 05:04 PM   Modules accepted: Orders

## 2019-08-10 NOTE — Progress Notes (Signed)
Depo given, right glute, tolerated well, next Depo card given. Patient counseled needs PE with next Depo. Patient given PCP list, emergency services card, MVI, quit smoking card, condoms, and both BP values written down to take to primary care appointment. Patient counseled on need to follow-up on elevated BP values today. (repeat BP was 157/98). Patient states understanding. Wet mount reviewed, patient treated for BV per SO.Marland KitchenJenetta Downer, RN

## 2019-08-10 NOTE — Progress Notes (Signed)
[place] problem visit  [place] Department  Subjective:  Mary Lawson is a 39 y.o. SBF G2P0 smoker being seen today for DMPA and STD screen  Chief Complaint  Patient presents with  . Contraception    depo  . Exposure to STD    HPI Smoker 1/2 ppd.  MJ daily.  Last ETOH last night (1/2 bottle Tequila)qo weekend.  LMP spotting.  Last sex end of May with condom.  C/o dysuria and sore on gum x2 days resolved now.  Last DMPA 05/15/19.  Needs PE with next DMPA (10/2019)  Does the patient have a current or past history of drug use? Yes   No components found for: HCV]   Health Maintenance Due  Topic Date Due  . Hepatitis C Screening  Never done  . COVID-19 Vaccine (1) Never done  . HIV Screening  Never done  . TETANUS/TDAP  Never done    ROS  The following portions of the patient's history were reviewed and updated as appropriate: allergies, current medications, past family history, past medical history, past social history, past surgical history and problem list. Problem list updated.   See flowsheet for other program required questions.  Objective:   Vitals:   08/10/19 1538  Weight: 199 lb 6.4 oz (90.4 kg)  Height: 5\' 2"  (1.575 m)    Physical Exam Vitals and nursing note reviewed.  Constitutional:      Appearance: Normal appearance. She is obese.  HENT:     Head: Normocephalic and atraumatic.     Comments: Gums without ulcers/sores    Mouth/Throat:     Mouth: Mucous membranes are moist.     Pharynx: Oropharynx is clear. No oropharyngeal exudate or posterior oropharyngeal erythema.  Eyes:     Conjunctiva/sclera: Conjunctivae normal.  Pulmonary:     Effort: Pulmonary effort is normal.  Abdominal:     Palpations: Abdomen is soft. There is no mass.     Tenderness: There is no abdominal tenderness. There is no rebound.     Comments: Poor tone, soft without tenderness, increased adipose  Genitourinary:    General: Normal vulva.      Exam position: Lithotomy position.     Pubic Area: No rash or pubic lice.      Labia:        Right: No rash or lesion.        Left: No rash or lesion.      Vagina: Normal. No vaginal discharge (menses blood moderate, ph>4.5), erythema, bleeding or lesions.     Cervix: Normal.     Uterus: Normal.      Adnexa: Right adnexa normal and left adnexa normal.     Rectum: Normal.  Lymphadenopathy:     Head:     Right side of head: No preauricular or posterior auricular adenopathy.     Left side of head: No preauricular or posterior auricular adenopathy.     Cervical: No cervical adenopathy.     Upper Body:     Right upper body: No supraclavicular or axillary adenopathy.     Left upper body: No supraclavicular or axillary adenopathy.     Lower Body: No right inguinal adenopathy. No left inguinal adenopathy.  Skin:    General: Skin is warm and dry.     Findings: No rash.  Neurological:     Mental Status: She is alert and oriented to person, place, and time.       Assessment and Plan:  Mary Lawson is a 39 y.o. female presenting to the [facility] Department for a Women's Health problem visit  1. Family planning services  - Multiple Vitamins-Minerals (MULTIVITAMIN) tablet; Take 1 tablet by mouth daily.  Dispense: 100 tablet; Refill: 0 - WET PREP FOR TRICH, YEAST, CLUE - Gonococcus culture - Syphilis Serology, [facility] - HIV [place] [facility] - Chlamydia/Gonorrhea [place] [facility]  2. Obesity, unspecified classification, unspecified obesity type, unspecified whether serious comorbidity present  3. Family planning See #4  4. Encounter for surveillance of injectable contraceptive DMPA 150 mg IM x1 Needs PE at next DMPA  5. Elevated blood pressure reading 146/102 on 08/10/19 Referred to Primary Care MD for evaluation--please give list to pt  6. Smoker 1/2 ppd Counseled via 5 A's to stop smoking  7. Marijuana use daily   8. Screening examination for venereal  disease Treat wet mount per standing orders Immunization nurse consult     No follow-ups on file.  No future appointments.  Herbie Saxon, CNM

## 2019-08-15 LAB — GONOCOCCUS CULTURE

## 2020-01-15 ENCOUNTER — Emergency Department: Payer: No Typology Code available for payment source

## 2020-01-15 ENCOUNTER — Emergency Department
Admission: EM | Admit: 2020-01-15 | Discharge: 2020-01-15 | Disposition: A | Discharge status: Home / Self Care | Payer: No Typology Code available for payment source | Attending: Emergency Medicine | Admitting: Emergency Medicine

## 2020-01-15 ENCOUNTER — Other Ambulatory Visit: Payer: Self-pay

## 2020-01-15 DIAGNOSIS — R918 Other nonspecific abnormal finding of lung field: Secondary | ICD-10-CM | POA: Insufficient documentation

## 2020-01-15 DIAGNOSIS — S1093XA Contusion of unspecified part of neck, initial encounter: Secondary | ICD-10-CM

## 2020-01-15 DIAGNOSIS — M542 Cervicalgia: Secondary | ICD-10-CM | POA: Diagnosis present

## 2020-01-15 DIAGNOSIS — M25561 Pain in right knee: Secondary | ICD-10-CM | POA: Insufficient documentation

## 2020-01-15 DIAGNOSIS — S301XXA Contusion of abdominal wall, initial encounter: Secondary | ICD-10-CM | POA: Diagnosis not present

## 2020-01-15 DIAGNOSIS — F17210 Nicotine dependence, cigarettes, uncomplicated: Secondary | ICD-10-CM | POA: Insufficient documentation

## 2020-01-15 DIAGNOSIS — R911 Solitary pulmonary nodule: Secondary | ICD-10-CM

## 2020-01-15 DIAGNOSIS — S1083XA Contusion of other specified part of neck, initial encounter: Secondary | ICD-10-CM | POA: Insufficient documentation

## 2020-01-15 DIAGNOSIS — Y92411 Interstate highway as the place of occurrence of the external cause: Secondary | ICD-10-CM | POA: Diagnosis not present

## 2020-01-15 LAB — COMPREHENSIVE METABOLIC PANEL
ALT: 29 U/L (ref 0–44)
AST: 37 U/L (ref 15–41)
Albumin: 3.8 g/dL (ref 3.5–5.0)
Alkaline Phosphatase: 41 U/L (ref 38–126)
Anion gap: 9 (ref 5–15)
BUN: 12 mg/dL (ref 6–20)
CO2: 20 mmol/L — ABNORMAL LOW (ref 22–32)
Calcium: 8.9 mg/dL (ref 8.9–10.3)
Chloride: 110 mmol/L (ref 98–111)
Creatinine, Ser: 0.7 mg/dL (ref 0.44–1.00)
GFR, Estimated: >60 mL/min (ref >60)
Glucose, Bld: 147 mg/dL — ABNORMAL HIGH (ref 70–99)
Potassium: 4.3 mmol/L (ref 3.5–5.1)
Sodium: 139 mmol/L (ref 135–145)
Total Bilirubin: 0.7 mg/dL (ref 0.3–1.2)
Total Protein: 7 g/dL (ref 6.5–8.1)

## 2020-01-15 LAB — URINALYSIS, COMPLETE (UACMP) WITH MICROSCOPIC
Bilirubin Urine: NEGATIVE
Glucose, UA: NEGATIVE mg/dL
Ketones, ur: NEGATIVE mg/dL
Leukocytes,Ua: NEGATIVE
Nitrite: NEGATIVE
Protein, ur: 30 mg/dL — AB
RBC / HPF: >50 RBC/hpf — ABNORMAL HIGH (ref 0–5)
Specific Gravity, Urine: 1.025 (ref 1.005–1.030)
pH: 5 (ref 5.0–8.0)

## 2020-01-15 LAB — CBC
HCT: 50.1 % — ABNORMAL HIGH (ref 36.0–46.0)
Hemoglobin: 16.3 g/dL — ABNORMAL HIGH (ref 12.0–15.0)
MCH: 28.8 pg (ref 26.0–34.0)
MCHC: 32.5 g/dL (ref 30.0–36.0)
MCV: 88.7 fL (ref 80.0–100.0)
Platelets: 221 10*3/uL (ref 150–400)
RBC: 5.65 MIL/uL — ABNORMAL HIGH (ref 3.87–5.11)
RDW: 14.9 % (ref 11.5–15.5)
WBC: 18.9 10*3/uL — ABNORMAL HIGH (ref 4.0–10.5)
nRBC: 0 % (ref 0.0–0.2)

## 2020-01-15 LAB — LIPASE, BLOOD: Lipase: 29 U/L (ref 11–51)

## 2020-01-15 LAB — POC URINE PREG, ED: Preg Test, Ur: NEGATIVE

## 2020-01-15 MED ORDER — ONDANSETRON 4 MG PO TBDP: 4.0000 mg | ORAL_TABLET | Freq: Three times a day (TID) | ORAL | 0 refills | Status: DC | PRN

## 2020-01-15 MED ORDER — ONDANSETRON 4 MG PO TBDP: 4.0000 mg | ORAL_TABLET | Freq: Three times a day (TID) | ORAL | 0 refills | Status: AC | PRN

## 2020-01-15 MED ORDER — SODIUM CHLORIDE 0.9 % IV BOLUS
1000.0000 mL | Freq: Once | INTRAVENOUS | Status: AC
  Administered 2020-01-15: 1000 mL via INTRAVENOUS

## 2020-01-15 MED ORDER — IOHEXOL 350 MG/ML SOLN
75.0000 mL | Freq: Once | INTRAVENOUS | Status: AC | PRN
  Administered 2020-01-15: 75 mL via INTRAVENOUS

## 2020-01-15 MED ORDER — HYDROCODONE-ACETAMINOPHEN 5-325 MG PO TABS: 1.0000 | ORAL_TABLET | Freq: Four times a day (QID) | ORAL | 0 refills | Status: DC | PRN

## 2020-01-15 MED ORDER — HYDROCODONE-ACETAMINOPHEN 5-325 MG PO TABS: 1.0000 | ORAL_TABLET | Freq: Four times a day (QID) | ORAL | 0 refills | Status: AC | PRN

## 2020-01-15 MED ORDER — CYCLOBENZAPRINE HCL 10 MG PO TABS: 10.0000 mg | ORAL_TABLET | Freq: Three times a day (TID) | ORAL | 0 refills | Status: AC | PRN

## 2020-01-15 MED ORDER — ONDANSETRON HCL 4 MG/2ML IJ SOLN
INTRAMUSCULAR | Status: AC
  Administered 2020-01-15: 4 mg
  Filled 2020-01-15: qty 2

## 2020-01-15 MED ORDER — HYDROMORPHONE HCL 1 MG/ML IJ SOLN
INTRAMUSCULAR | Status: AC
  Administered 2020-01-15: 1 mg via INTRAVENOUS
  Filled 2020-01-15: qty 1

## 2020-01-15 MED ORDER — IOHEXOL 300 MG/ML  SOLN
100.0000 mL | Freq: Once | INTRAMUSCULAR | Status: AC | PRN
  Administered 2020-01-15: 100 mL via INTRAVENOUS

## 2020-01-15 MED ORDER — HYDROCODONE-ACETAMINOPHEN 5-325 MG PO TABS
2.0000 | ORAL_TABLET | Freq: Once | ORAL | Status: AC
  Administered 2020-01-15: 2 via ORAL
  Filled 2020-01-15: qty 2

## 2020-01-15 MED ORDER — CYCLOBENZAPRINE HCL 10 MG PO TABS: 10.0000 mg | ORAL_TABLET | Freq: Three times a day (TID) | ORAL | 0 refills | Status: DC | PRN
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
## 2020-01-15 NOTE — ED Notes (Signed)
Patient transported to CT 
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
## 2021-07-09 ENCOUNTER — Ambulatory Visit (LOCAL_COMMUNITY_HEALTH_CENTER): Payer: Medicaid Other | Admitting: Physician Assistant

## 2021-07-09 ENCOUNTER — Encounter: Payer: Self-pay | Admitting: Physician Assistant

## 2021-07-09 VITALS — BP 116/83 | HR 69 | Ht 62.0 in | Wt 198.0 lb

## 2021-07-09 DIAGNOSIS — E669 Obesity, unspecified: Secondary | ICD-10-CM

## 2021-07-09 DIAGNOSIS — Z30013 Encounter for initial prescription of injectable contraceptive: Secondary | ICD-10-CM

## 2021-07-09 DIAGNOSIS — Z6836 Body mass index (BMI) 36.0-36.9, adult: Secondary | ICD-10-CM

## 2021-07-09 DIAGNOSIS — Z3202 Encounter for pregnancy test, result negative: Secondary | ICD-10-CM | POA: Diagnosis not present

## 2021-07-09 DIAGNOSIS — Z0000 Encounter for general adult medical examination without abnormal findings: Secondary | ICD-10-CM

## 2021-07-09 DIAGNOSIS — Z3009 Encounter for other general counseling and advice on contraception: Secondary | ICD-10-CM | POA: Diagnosis not present

## 2021-07-09 LAB — PREGNANCY, URINE: Preg Test, Ur: NEGATIVE

## 2021-07-09 MED ORDER — MEDROXYPROGESTERONE ACETATE 150 MG/ML IM SUSP: 150.0000 mg | INTRAMUSCULAR | 3 refills | Status: DC

## 2021-07-09 MED ORDER — MEDROXYPROGESTERONE ACETATE 150 MG/ML IM SUSP
150.0000 mg | INTRAMUSCULAR | Status: AC
  Administered 2021-07-09 – 2022-02-03 (×3): 150 mg via INTRAMUSCULAR

## 2021-07-09 NOTE — Progress Notes (Signed)
[facility] ?[place] ?Main Number: [phone] ? ? ? ?Family Planning Visit- Initial Visit ? ?Subjective:  ?Mary Lawson is a 41 y.o.  [id number]   being seen today for an initial annual visit and to discuss reproductive life planning.  The patient is currently using No Method - No Contraceptive Precautions for pregnancy prevention. Patient reports   does not want a pregnancy in the next year.   ? ? report they are looking for a method that provides High efficacy at preventing pregnancy and Minimal bleeding/improved bleeding profile ? ?Patient has the following medical conditions has Encounter for surveillance of injectable contraceptive; Left ovarian cyst; Obesity BMI=36.4; Elevated blood pressure reading 146/102 on 08/10/19; Smoker 1/2 ppd; and Marijuana use daily on their problem list. ? ?Chief Complaint  ?Patient presents with  ? Annual Exam  ? Contraception  ?  Desires Depo  ? ? ?Patient reports she wants to restart DMPA. Had been off DMPA for a while while her partner desired a pregnancy, but no longer does. States no official diagnosis of hypertension. ? ?Patient denies concerns today.   ? ?Body mass index is 36.21 kg/m?. - Patient is eligible for diabetes screening based on BMI and age >84?  yes ?HA1C ordered? yes ? ?Patient reports 1  partner/s in last year. Desires STI screening?  No - declines ? ?Has patient been screened once for HCV in the past?  No ? No results found for: HCVAB ? ?Does the patient have current drug use (including MJ), have a partner with drug use, and/or has been incarcerated since last result? Yes  ?If yes-- Screen for HCV through [place] ?  ?Does the patient meet criteria for HBV testing? Yes ? ?Criteria:  ?-Household, sexual or needle sharing contact with HBV ?-History of drug use ?-HIV positive ?-Those with known Hep C ? ? ?Health Maintenance Due  ?Topic Date Due  ? COVID-19 Vaccine (1) Never done  ? HIV  Screening  Never done  ? Hepatitis C Screening  Never done  ? TETANUS/TDAP  Never done  ? ? ?Review of Systems  ?Constitutional: Negative.   ?HENT: Negative.    ?Eyes: Negative.   ?Respiratory: Negative.    ?Cardiovascular: Negative.   ?Gastrointestinal: Negative.   ?Genitourinary: Negative.   ?Musculoskeletal: Negative.   ?Skin: Negative.   ?Neurological: Negative.   ?Endo/Heme/Allergies: Negative.   ?Psychiatric/Behavioral: Negative.    ? ?The following portions of the patient's history were reviewed and updated as appropriate: allergies, current medications, past family history, past medical history, past social history, past surgical history and problem list. Problem list updated. ? ? ?See flowsheet for other program required questions. ? ?Objective:  ? ?Vitals:  ? 07/09/21 1028  ?BP: 116/83  ?Pulse: 69  ?Weight: 198 lb (89.8 kg)  ?Height: '5\' 2"'$  (1.575 m)  ? ? ?Physical Exam ?Constitutional:   ?   Appearance: Normal appearance. She is obese.  ?HENT:  ?   Head: Normocephalic and atraumatic.  ?Cardiovascular:  ?   Rate and Rhythm: Normal rate and regular rhythm.  ?   Pulses: Normal pulses.  ?   Heart sounds: Normal heart sounds.  ?Pulmonary:  ?   Effort: Pulmonary effort is normal.  ?   Breath sounds: Normal breath sounds.  ?Chest:  ?Breasts: ?   Tanner Score is 5.  ?   Right: No inverted nipple, mass or tenderness.  ?   Left: No inverted nipple, mass or  tenderness.  ?   Comments: See skin exam ?Abdominal:  ?   Palpations: Abdomen is soft.  ?Musculoskeletal:     ?   General: Normal range of motion.  ?   Cervical back: Neck supple.  ?Lymphadenopathy:  ?   Upper Body:  ?   Right upper body: No supraclavicular, axillary or pectoral adenopathy.  ?   Left upper body: No supraclavicular, axillary or pectoral adenopathy.  ?Skin: ?   General: Skin is warm and dry.  ?   Comments: Scarring/puckered skin/hyperpigmented skin in bilat axilla and lower breasts c/w prior hydradenitis supperiativa infections.  ?Neurological:  ?    General: No focal deficit present.  ?   Mental Status: She is alert.  ?Psychiatric:     ?   Mood and Affect: Mood normal.     ?   Behavior: Behavior normal.  ? ? ? ? ?Assessment and Plan:  ?Mary Lawson is a 41 y.o. female presenting to the [facility] Department for an initial annual wellness/contraceptive visit ? ?Contraception counseling: Reviewed options based on patient desire and reproductive life plan. Patient is interested in Hormonal Injection. This was provided to the patient today.  ? ?Risks, benefits, and typical effectiveness rates were reviewed.  Questions were answered.  Written information was also given to the patient to review.   ? ?The patient will follow up in  3 months for surveillance.  The patient was told to call with any further questions, or with any concerns about this method of contraception.  Emphasized use of condoms 100% of the time for STI prevention. ? ?Need for ECP was assessed. Patient reported > 120 hours .  Reviewed options and patient desired No method of ECP, declined all   ? ?1. Encounter for well woman exam without gynecological exam ?Next Pap cervical cancer screening due 10/2023, breast CA screening guidelines evolving and might change from starting at 50 to starting at 38, so consider at next PE. Enc to est PCP, dental provider. Pt declined HIV/HCV/HBV/Syphilis testing. ? ?2. Family planning services ?If UPT is neg, give DMPA '150mg'$  IM today and repeat every 3 mo for 1 year. ?- Pregnancy, urine ? ?3. Class 2 obesity without serious comorbidity with body mass index (BMI) of 36.0 to 36.9 in adult, unspecified obesity type ?Enc diet/exercise and DM screening - pt desires today. ?- Hgb A1c w/o eAG ? ?4. Tobacco smoking ?Counseled cessation via 5As, pt accepted referral to [facility]. ? ?Return in 11 weeks (on 09/24/2021) for Routine DMPA injection. ? ?No future appointments. ? ?Lora Havens, PA-C ?

## 2021-07-09 NOTE — Progress Notes (Signed)
UPT negative. Client tolerated Depo injection with minimal complaint. Rich Number, RN ? ?

## 2021-07-10 ENCOUNTER — Telehealth: Payer: Self-pay

## 2021-07-10 ENCOUNTER — Encounter: Payer: Self-pay | Admitting: Family Medicine

## 2021-07-10 DIAGNOSIS — R7303 Prediabetes: Secondary | ICD-10-CM | POA: Insufficient documentation

## 2021-07-10 LAB — HGB A1C W/O EAG: Hgb A1c MFr Bld: 5.7 % — ABNORMAL HIGH (ref 4.8–5.6)

## 2021-07-10 NOTE — Telephone Encounter (Signed)
Call to client with Hgb A1c results and shared with her Dr. Romona Curls recommendation to establish care with a PCP to be monitored for progression to diabetes / reduce risk of this occurring. Client reminded that she has [place] sheet that was in her educational bag she received in clinic. Rich Number, RN ? ?
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
## 2021-09-29 ENCOUNTER — Ambulatory Visit (LOCAL_COMMUNITY_HEALTH_CENTER): Payer: Medicaid Other

## 2021-09-29 VITALS — BP 138/90 | Ht 62.0 in | Wt 201.5 lb

## 2021-09-29 DIAGNOSIS — Z3009 Encounter for other general counseling and advice on contraception: Secondary | ICD-10-CM | POA: Diagnosis not present

## 2021-09-29 DIAGNOSIS — Z3042 Encounter for surveillance of injectable contraceptive: Secondary | ICD-10-CM

## 2021-09-29 NOTE — Progress Notes (Signed)
Patient seen in nurse clinic 11 weeks 6 days post Depo.  Voices no issues.  Light bleeding or spotting for approximately 2 weeks at 9 weeks post Depo. B/P 137/94 and retake was 138/90.  Patient has not gotten a PCP but is looking.  List of providers was given to patient.  Discussed PCP needed for HgbA1C and to monitor blood pressure since borderline. Medroxyprogesterone Acetate '150mg'$ . administered in RUOQ per order dated 07/09/21 by A. Streilein, PA-C.  Tolerated well.  Reminder card provided,

## 2022-01-01 ENCOUNTER — Ambulatory Visit: Payer: Medicaid Other

## 2022-01-05 ENCOUNTER — Telehealth: Payer: Self-pay | Admitting: Family Medicine

## 2022-01-05 NOTE — Telephone Encounter (Signed)
Spoke with Rosamaria Lints regarding plan to schedule patient in Nurse Clinic for Depo Provera injection.  This is appropriate as pt will not need to be seen by Rosalee Kaufman, RN

## 2022-01-05 NOTE — Telephone Encounter (Signed)
Pt has missed her last appts for DEPO, I rescheduled her for Wed 11/13th but advised her that she is on the very edge of her window (16wks) and that I would check if she needed to see a provided instead. Please let me know if she can still be seen at nurse clinic. Thanks

## 2022-01-13 ENCOUNTER — Ambulatory Visit: Payer: Medicaid Other

## 2022-02-03 ENCOUNTER — Ambulatory Visit: Payer: Medicaid Other

## 2022-02-03 ENCOUNTER — Ambulatory Visit (LOCAL_COMMUNITY_HEALTH_CENTER): Payer: Medicaid Other | Admitting: Family Medicine

## 2022-02-03 VITALS — BP 155/96 | HR 66 | Resp 16 | Ht 62.0 in | Wt 202.0 lb

## 2022-02-03 DIAGNOSIS — Z309 Encounter for contraceptive management, unspecified: Secondary | ICD-10-CM | POA: Diagnosis not present

## 2022-02-03 DIAGNOSIS — Z3202 Encounter for pregnancy test, result negative: Secondary | ICD-10-CM | POA: Diagnosis not present

## 2022-02-03 DIAGNOSIS — I10 Essential (primary) hypertension: Secondary | ICD-10-CM | POA: Insufficient documentation

## 2022-02-03 DIAGNOSIS — Z30013 Encounter for initial prescription of injectable contraceptive: Secondary | ICD-10-CM | POA: Diagnosis not present

## 2022-02-03 DIAGNOSIS — Z3009 Encounter for other general counseling and advice on contraception: Secondary | ICD-10-CM

## 2022-02-03 LAB — PREGNANCY, URINE: Preg Test, Ur: NEGATIVE

## 2022-02-03 MED ORDER — ULIPRISTAL ACETATE 30 MG PO TABS: 30.0000 mg | ORAL_TABLET | Freq: Once | ORAL | Status: DC

## 2022-02-03 MED ORDER — ULIPRISTAL ACETATE 30 MG PO TABS: 1.0000 | ORAL_TABLET | Freq: Once | ORAL | 0 refills | Status: DC

## 2022-02-03 MED ORDER — ULIPRISTAL ACETATE 30 MG PO TABS: 1.0000 | ORAL_TABLET | Freq: Once | ORAL | 0 refills | Status: AC

## 2022-02-03 NOTE — Progress Notes (Cosign Needed Addendum)
   [place] problem visit  [facility]  Subjective:  Mary Lawson is a 41 y.o. being seen today for late depo  Chief Complaint  Patient presents with   Contraception    Depo    HPI Patient seen for late depo. BP elevated today 149/90, with repeat 155/96. I reviewed the chart, and patient has had elevated BP for some time, does not have PCP. Discussed with patient that pregnancy with uncontrolled high BP is more risky than providing her the depo today. Strongly encouraged patient to get a PCP, briefly reviewed long term consequences of uncontrolled HTN, patient states she will call a PCP today.   Does the patient have a current or past history of drug use? No   No components found for: "HCV"]   Health Maintenance Due  Topic Date Due   COVID-19 Vaccine (1) Never done   HIV Screening  Never done   Hepatitis C Screening  Never done   DTaP/Tdap/Td (1 - Tdap) Never done   INFLUENZA VACCINE  Never done   PAP SMEAR-Modifier  10/31/2021    ROS  The following portions of the patient's history were reviewed and updated as appropriate: allergies, current medications, past family history, past medical history, past social history, past surgical history and problem list. Problem list updated.   See flowsheet for other program required questions.  Objective:   Vitals:   02/03/22 0817 02/03/22 0848  BP: (!) 149/90 (!) 155/96  Pulse:  66  Resp: 16   Weight: 202 lb (91.6 kg)   Height: '5\' 2"'$  (1.575 m)     Physical Exam Constitutional:      Appearance: Normal appearance.  HENT:     Head: Normocephalic.  Cardiovascular:     Rate and Rhythm: Normal rate.  Pulmonary:     Effort: Pulmonary effort is normal.  Skin:    General: Skin is warm.  Neurological:     Mental Status: She is alert.     Assessment and Plan:  Mary Lawson is a 41 y.o. female presenting to the [facility] for a Women's Health  problem visit  1. Family planning services  Depo shot administered after negative pregnancy test.  1 dose of Ella  ordered to pharmacy. recent unprotected sex.   - Pregnancy, urine - ulipristal acetate (ELLA) tablet 30 mg   RTC with symptoms or concerns.   Sharlet Salina, [place]

## 2022-02-03 NOTE — Progress Notes (Signed)
Patient here for depo administration. Last depo given 09/29/21 (35w1dost depo).   Patient last sex was 01/30/22 without condom.  LMP: 01/20/22  Pregnancy test negative -  reviewed with provider.   BP: 149/90 and 15 mins later 155/96. Per provider, HArt BuffFNP, patient can get depo today. Gave patient list of primary care doctors in the area so she can get care regarding HTN. Patient verbalized understanding.   Depo given LUOQ - tolerated well. Reminder card given to return in 11 weeks for next depo ~ 04/21/22.   EFestus HoltsRx order sent to patient's pharmacy - pt is aware. Education given and questions answered.   EAl Decant RN

## 2022-02-03 NOTE — Addendum Note (Signed)
Addended by: Sharlet Salina on: 02/03/2022 09:34 AM   Modules accepted: Orders

## 2022-06-10 DIAGNOSIS — R0602 Shortness of breath: Secondary | ICD-10-CM | POA: Diagnosis not present

## 2022-10-21 DIAGNOSIS — Z0131 Encounter for examination of blood pressure with abnormal findings: Secondary | ICD-10-CM | POA: Diagnosis not present

## 2022-10-21 DIAGNOSIS — Z1389 Encounter for screening for other disorder: Secondary | ICD-10-CM | POA: Diagnosis not present

## 2022-10-21 DIAGNOSIS — Z309 Encounter for contraceptive management, unspecified: Secondary | ICD-10-CM | POA: Diagnosis not present

## 2022-10-26 ENCOUNTER — Other Ambulatory Visit: Payer: Self-pay | Admitting: Family Medicine

## 2022-10-26 DIAGNOSIS — Z1231 Encounter for screening mammogram for malignant neoplasm of breast: Secondary | ICD-10-CM

## 2022-11-11 DIAGNOSIS — Z0131 Encounter for examination of blood pressure with abnormal findings: Secondary | ICD-10-CM | POA: Diagnosis not present

## 2022-11-11 DIAGNOSIS — Z124 Encounter for screening for malignant neoplasm of cervix: Secondary | ICD-10-CM | POA: Diagnosis not present

## 2022-11-11 DIAGNOSIS — I10 Essential (primary) hypertension: Secondary | ICD-10-CM | POA: Diagnosis not present

## 2022-11-11 DIAGNOSIS — Z113 Encounter for screening for infections with a predominantly sexual mode of transmission: Secondary | ICD-10-CM | POA: Diagnosis not present

## 2022-11-11 DIAGNOSIS — Z1389 Encounter for screening for other disorder: Secondary | ICD-10-CM | POA: Diagnosis not present

## 2022-11-11 DIAGNOSIS — R7309 Other abnormal glucose: Secondary | ICD-10-CM | POA: Diagnosis not present

## 2022-11-11 DIAGNOSIS — Z0000 Encounter for general adult medical examination without abnormal findings: Secondary | ICD-10-CM | POA: Diagnosis not present

## 2022-11-11 DIAGNOSIS — H00019 Hordeolum externum unspecified eye, unspecified eyelid: Secondary | ICD-10-CM | POA: Diagnosis not present

## 2022-11-19 ENCOUNTER — Ambulatory Visit
Admission: RE | Admit: 2022-11-19 | Discharge: 2022-11-19 | Disposition: A | Discharge status: Home / Self Care | Payer: 59 | Source: Ambulatory Visit | Attending: Family Medicine | Admitting: Family Medicine

## 2022-11-19 DIAGNOSIS — Z1231 Encounter for screening mammogram for malignant neoplasm of breast: Secondary | ICD-10-CM | POA: Insufficient documentation

## 2022-11-22 ENCOUNTER — Encounter: Payer: Self-pay | Admitting: Family Medicine

## 2022-12-01 ENCOUNTER — Encounter: Payer: Self-pay | Admitting: Family Medicine

## 2022-12-02 ENCOUNTER — Other Ambulatory Visit: Payer: Self-pay | Admitting: Family Medicine

## 2022-12-02 DIAGNOSIS — R928 Other abnormal and inconclusive findings on diagnostic imaging of breast: Secondary | ICD-10-CM

## 2022-12-08 ENCOUNTER — Ambulatory Visit
Admission: RE | Admit: 2022-12-08 | Discharge: 2022-12-08 | Disposition: A | Discharge status: Home / Self Care | Payer: 59 | Source: Ambulatory Visit | Attending: Family Medicine | Admitting: Family Medicine

## 2022-12-08 DIAGNOSIS — R928 Other abnormal and inconclusive findings on diagnostic imaging of breast: Secondary | ICD-10-CM

## 2022-12-08 DIAGNOSIS — R92322 Mammographic fibroglandular density, left breast: Secondary | ICD-10-CM | POA: Diagnosis not present

## 2022-12-08 DIAGNOSIS — N6321 Unspecified lump in the left breast, upper outer quadrant: Secondary | ICD-10-CM | POA: Diagnosis not present

## 2023-04-19 ENCOUNTER — Other Ambulatory Visit: Payer: Self-pay | Admitting: Family Medicine

## 2023-04-19 DIAGNOSIS — N63 Unspecified lump in unspecified breast: Secondary | ICD-10-CM

## 2023-04-19 DIAGNOSIS — N6320 Unspecified lump in the left breast, unspecified quadrant: Secondary | ICD-10-CM

## 2023-04-19 DIAGNOSIS — N6489 Other specified disorders of breast: Secondary | ICD-10-CM

## 2023-06-10 ENCOUNTER — Ambulatory Visit
Admission: RE | Admit: 2023-06-10 | Discharge: 2023-06-10 | Disposition: A | Discharge status: Home / Self Care | Payer: 59 | Source: Ambulatory Visit | Attending: Family Medicine | Admitting: Family Medicine

## 2023-06-10 DIAGNOSIS — N6320 Unspecified lump in the left breast, unspecified quadrant: Secondary | ICD-10-CM

## 2023-06-10 DIAGNOSIS — N6489 Other specified disorders of breast: Secondary | ICD-10-CM

## 2023-06-10 DIAGNOSIS — N63 Unspecified lump in unspecified breast: Secondary | ICD-10-CM | POA: Diagnosis present

## 2023-11-29 ENCOUNTER — Other Ambulatory Visit: Payer: Self-pay | Admitting: Family Medicine

## 2023-12-07 ENCOUNTER — Other Ambulatory Visit: Payer: Self-pay | Admitting: Family Medicine

## 2023-12-07 DIAGNOSIS — N6489 Other specified disorders of breast: Secondary | ICD-10-CM

## 2023-12-07 DIAGNOSIS — N63 Unspecified lump in unspecified breast: Secondary | ICD-10-CM
